# Patient Record
Sex: Male | Born: 1960 | Race: White | Hispanic: No | Marital: Married | Smoking: Never smoker
Health system: Southern US, Community
[De-identification: ages and names within clinical notes are randomized; demographics above are authoritative.]

## PROBLEM LIST (undated history)

## (undated) DIAGNOSIS — G479 Sleep disorder, unspecified: Secondary | ICD-10-CM

## (undated) DIAGNOSIS — I10 Essential (primary) hypertension: Secondary | ICD-10-CM

## (undated) DIAGNOSIS — T7840XA Allergy, unspecified, initial encounter: Secondary | ICD-10-CM

## (undated) DIAGNOSIS — K5792 Diverticulitis of intestine, part unspecified, without perforation or abscess without bleeding: Secondary | ICD-10-CM

## (undated) DIAGNOSIS — E785 Hyperlipidemia, unspecified: Secondary | ICD-10-CM

## (undated) DIAGNOSIS — J45909 Unspecified asthma, uncomplicated: Secondary | ICD-10-CM

## (undated) DIAGNOSIS — G4730 Sleep apnea, unspecified: Secondary | ICD-10-CM

## (undated) DIAGNOSIS — K219 Gastro-esophageal reflux disease without esophagitis: Secondary | ICD-10-CM

## (undated) HISTORY — DX: Allergy, unspecified, initial encounter: T78.40XA

## (undated) HISTORY — DX: Unspecified asthma, uncomplicated: J45.909

## (undated) HISTORY — PX: NO PAST SURGERIES: SHX2092

## (undated) HISTORY — DX: Diverticulitis of intestine, part unspecified, without perforation or abscess without bleeding: K57.92

## (undated) HISTORY — PX: WISDOM TOOTH EXTRACTION: SHX21

## (undated) HISTORY — DX: Sleep disorder, unspecified: G47.9

## (undated) HISTORY — DX: Gastro-esophageal reflux disease without esophagitis: K21.9

## (undated) HISTORY — DX: Hyperlipidemia, unspecified: E78.5

## (undated) HISTORY — DX: Essential (primary) hypertension: I10

## (undated) HISTORY — DX: Sleep apnea, unspecified: G47.30

---

## 2005-06-02 ENCOUNTER — Ambulatory Visit: Payer: Self-pay | Admitting: Internal Medicine

## 2005-06-23 ENCOUNTER — Inpatient Hospital Stay: Payer: Self-pay

## 2005-06-25 ENCOUNTER — Encounter: Payer: Self-pay | Admitting: Internal Medicine

## 2005-06-30 ENCOUNTER — Encounter: Payer: Self-pay | Admitting: Internal Medicine

## 2005-07-31 ENCOUNTER — Encounter: Payer: Self-pay | Admitting: Internal Medicine

## 2005-07-31 ENCOUNTER — Encounter (INDEPENDENT_AMBULATORY_CARE_PROVIDER_SITE_OTHER): Payer: Self-pay | Admitting: *Deleted

## 2005-07-31 ENCOUNTER — Ambulatory Visit: Payer: Self-pay | Admitting: Internal Medicine

## 2005-07-31 LAB — HM COLONOSCOPY

## 2006-09-07 IMAGING — CT CT ABD-PELV W/ CM
1 of 2 series · 15 of 32 positions shown, 20 images · non-contrast
Comparison: none

REASON FOR EXAM: Abdominal pain with IV and PO contrast
COMMENTS:

[Series 2: appendicitis · axial · 0.93mm/px · z∈[-528,-82]mm · 15 of 165 slices shown, 20 images]
[im 8/165  soft-tissue]
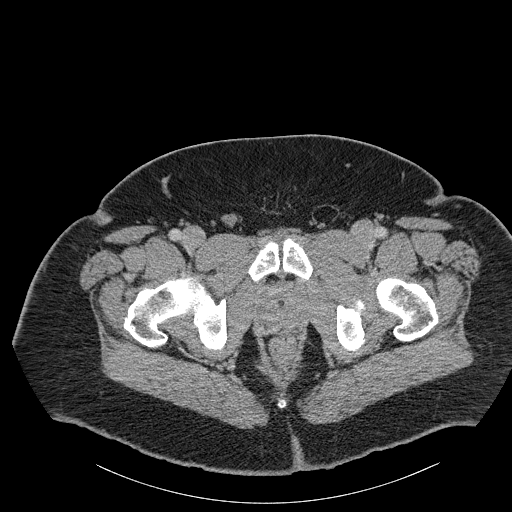
[im 8/165  bone]
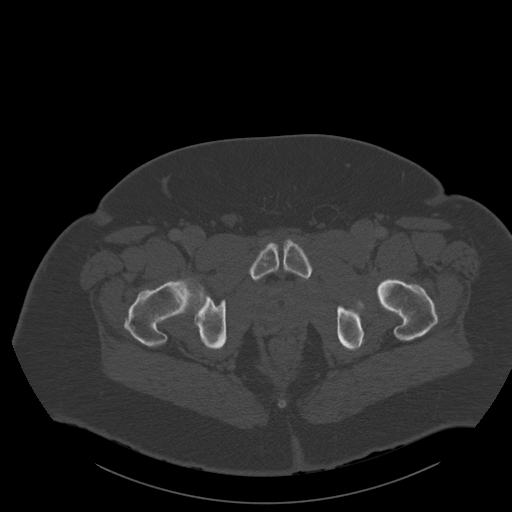
[im 22/165  soft-tissue]
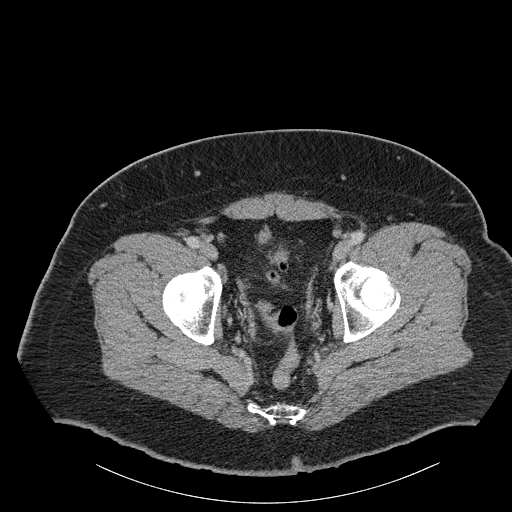
[im 29/165  soft-tissue]
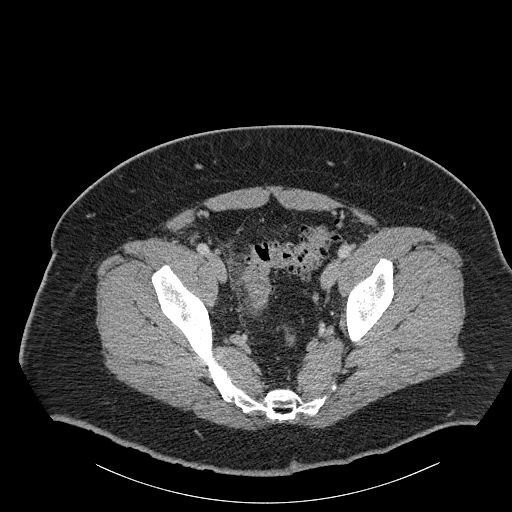
[im 43/165  soft-tissue]
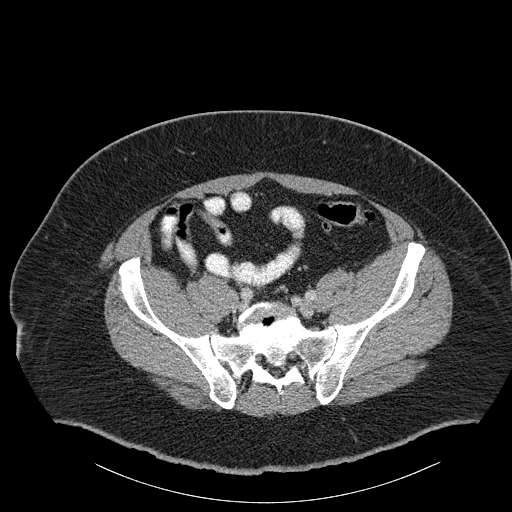
[im 58/165  soft-tissue]
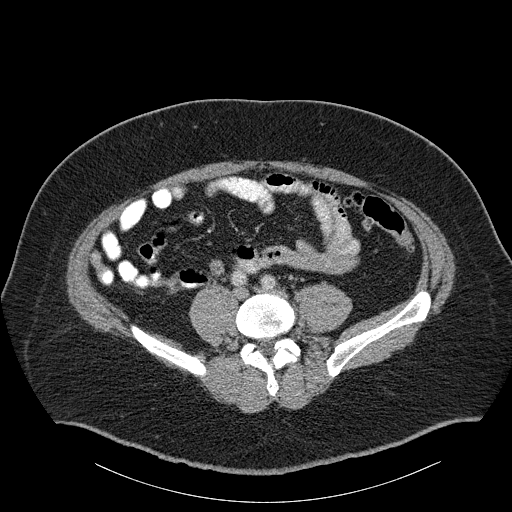
[im 65/165  soft-tissue]
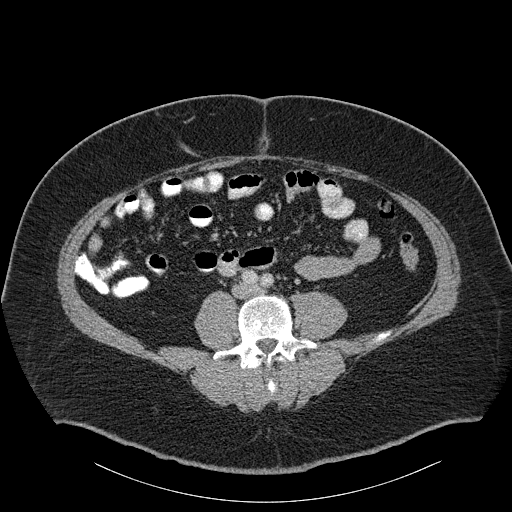
[im 79/165  soft-tissue]
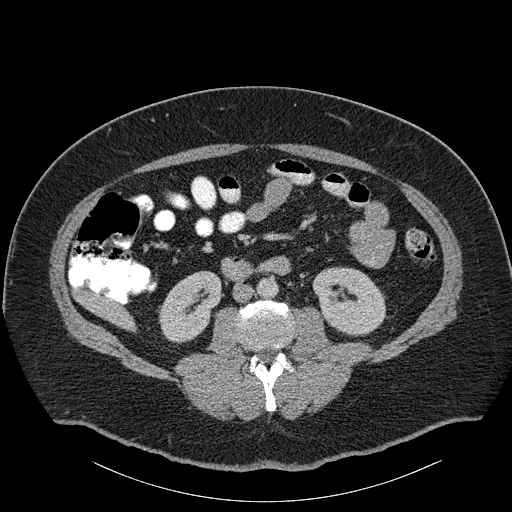
[im 86/165  soft-tissue]
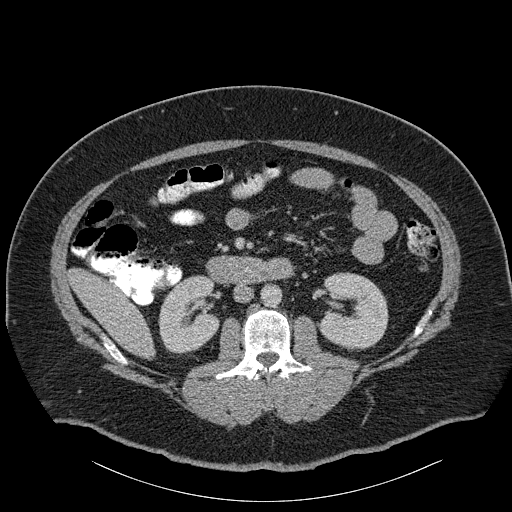
[im 100/165  soft-tissue]
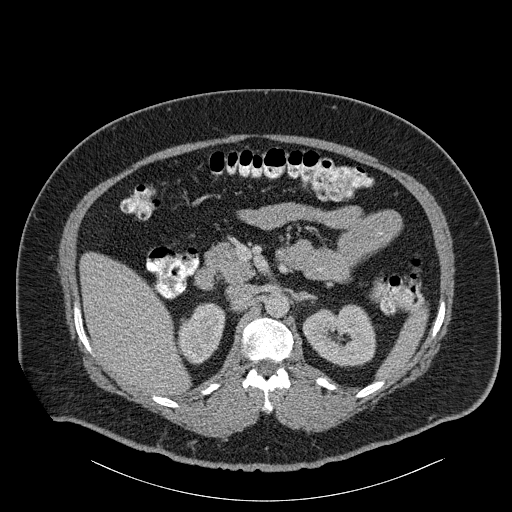
[im 100/165  bone]
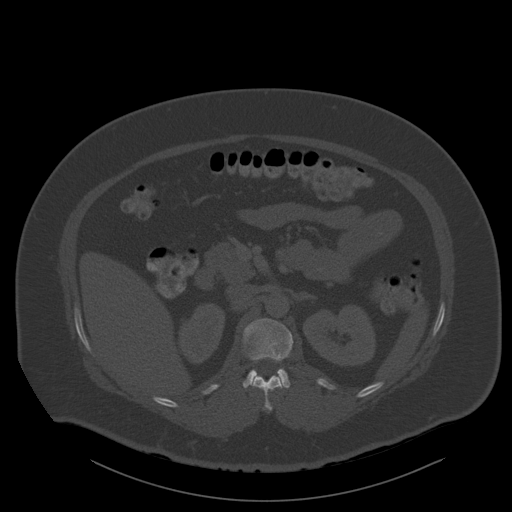
[im 107/165  soft-tissue]
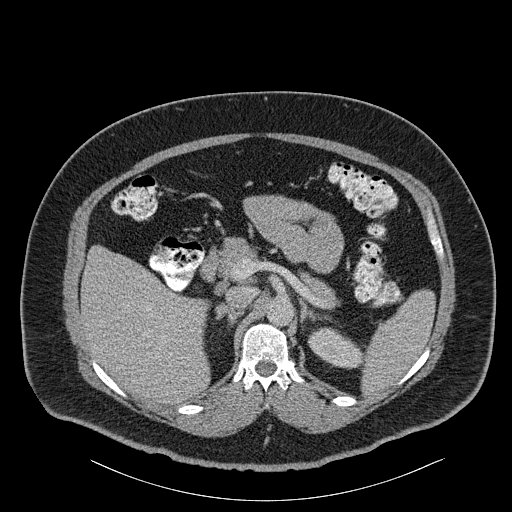
[im 122/165  soft-tissue]
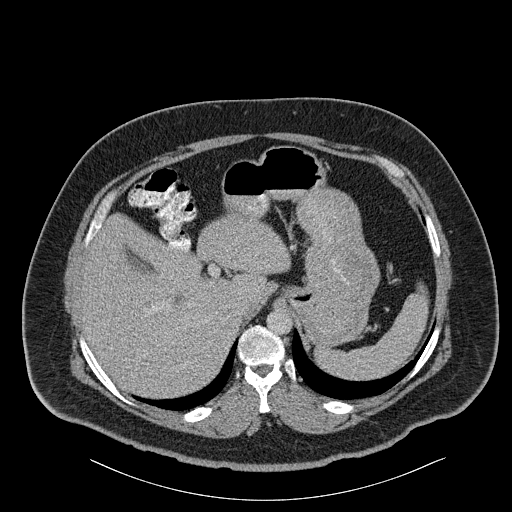
[im 136/165  soft-tissue]
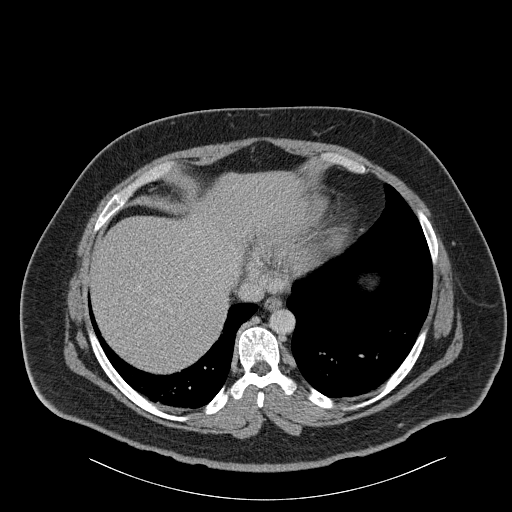
[im 136/165  lung]
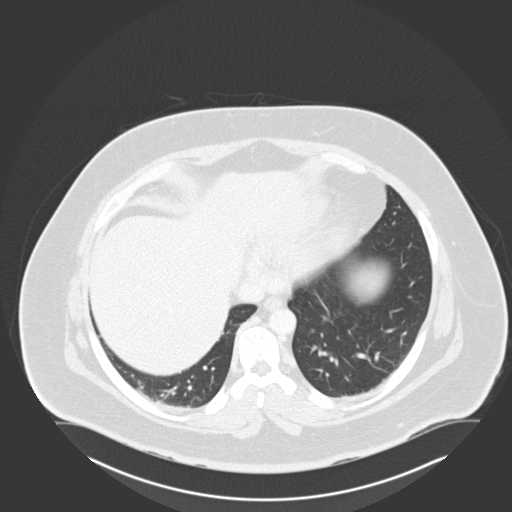
[im 143/165  soft-tissue]
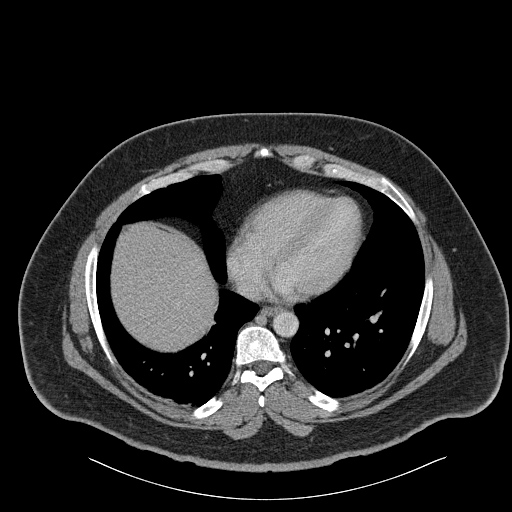
[im 143/165  lung]
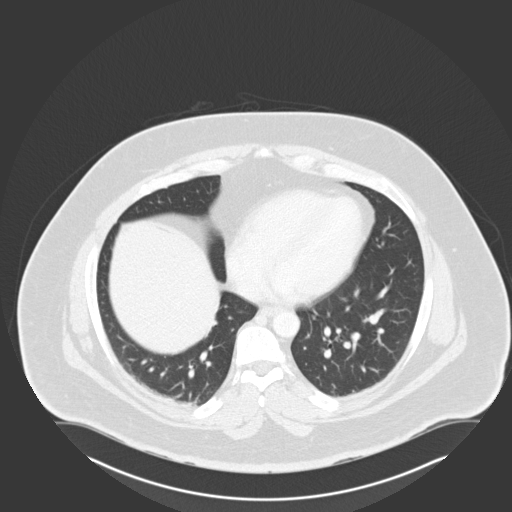
[im 150/165  lung]
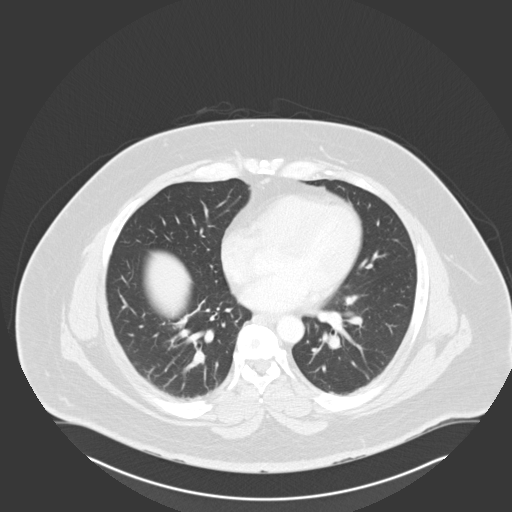
[im 157/165  soft-tissue]
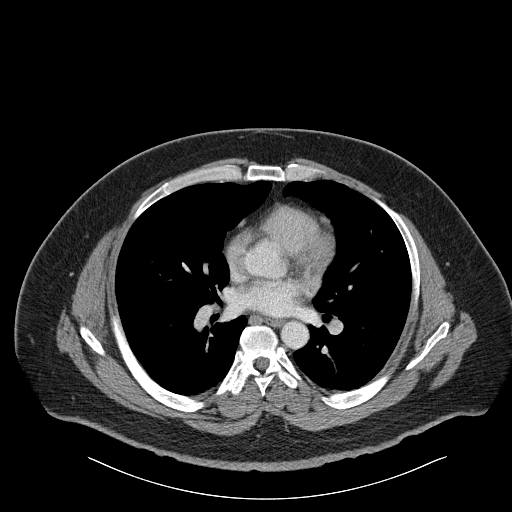
[im 157/165  lung]
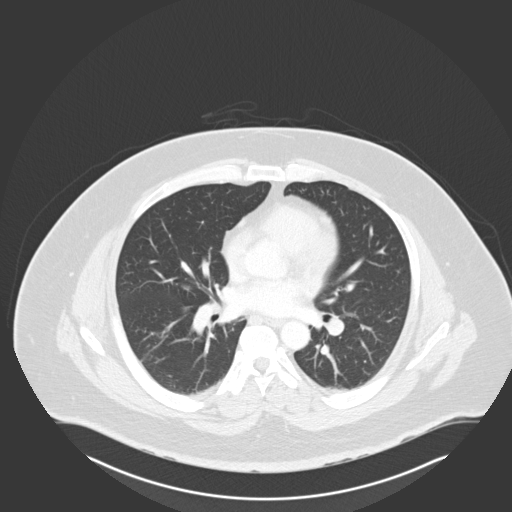

[15 of 32 positions shown; findings below may reference images not displayed]

PROCEDURE:     CT  - CT ABDOMEN / PELVIS  W  - June 23, 2005 [DATE]

RESULT:     IV and oral contrast-enhanced CT scan of the abdomen and pelvis
demonstrates a scattered diverticula in the colon especially in the sigmoid
region.  Foley balloon is present in the urinary bladder.  There is mild
inflammatory stranding to the RIGHT of midline adjacent to the sigmoid colon
best seen on the areas of image 137 and 138.  A definite abscess is not
identified.  No free air is seen.  No free fluid is evident.  The aorta is
normal in size.  The kidneys, liver, pancreas, gallbladder, spleen, and
adrenal glands appear to be unremarkable.
IMPRESSION: Findings consistent with sigmoid diverticulitis.  No focal abscess evident.

## 2007-03-15 ENCOUNTER — Ambulatory Visit: Payer: Self-pay | Admitting: Internal Medicine

## 2007-03-15 DIAGNOSIS — I10 Essential (primary) hypertension: Secondary | ICD-10-CM | POA: Insufficient documentation

## 2007-03-15 DIAGNOSIS — K219 Gastro-esophageal reflux disease without esophagitis: Secondary | ICD-10-CM | POA: Insufficient documentation

## 2007-03-18 LAB — CONVERTED CEMR LAB
Albumin: 3.7 g/dL (ref 3.5–5.2)
Basophils Absolute: 0 10*3/uL (ref 0.0–0.1)
Calcium: 8.7 mg/dL (ref 8.4–10.5)
Chloride: 105 meq/L (ref 96–112)
Eosinophils Absolute: 0.2 10*3/uL (ref 0.0–0.6)
GFR calc Af Amer: 116 mL/min
GFR calc non Af Amer: 96 mL/min
MCHC: 33.3 g/dL (ref 30.0–36.0)
MCV: 92.5 fL (ref 78.0–100.0)
Monocytes Relative: 12.7 % — ABNORMAL HIGH (ref 3.0–11.0)
Potassium: 3.4 meq/L — ABNORMAL LOW (ref 3.5–5.1)
RBC: 4.91 M/uL (ref 4.22–5.81)
RDW: 13 % (ref 11.5–14.6)
Sodium: 142 meq/L (ref 135–145)

## 2007-03-21 ENCOUNTER — Encounter: Payer: Self-pay | Admitting: Internal Medicine

## 2007-03-29 ENCOUNTER — Ambulatory Visit: Payer: Self-pay | Admitting: Internal Medicine

## 2007-03-29 LAB — CONVERTED CEMR LAB
Cholesterol: 165 mg/dL (ref 0–200)
HDL: 29.6 mg/dL — ABNORMAL LOW (ref >39.0)
LDL Cholesterol: 116 mg/dL — ABNORMAL HIGH (ref 0–99)
VLDL: 19 mg/dL (ref 0–40)

## 2007-04-10 ENCOUNTER — Encounter: Payer: Self-pay | Admitting: Internal Medicine

## 2007-11-15 ENCOUNTER — Ambulatory Visit: Payer: Self-pay | Admitting: Internal Medicine

## 2007-11-18 LAB — CONVERTED CEMR LAB
BUN: 13 mg/dL (ref 6–23)
Calcium: 8.9 mg/dL (ref 8.4–10.5)
Chloride: 104 meq/L (ref 96–112)
GFR calc Af Amer: 116 mL/min
GFR calc non Af Amer: 96 mL/min
Phosphorus: 3.1 mg/dL (ref 2.3–4.6)
Potassium: 3.9 meq/L (ref 3.5–5.1)
Sodium: 142 meq/L (ref 135–145)

## 2008-05-22 ENCOUNTER — Ambulatory Visit: Payer: Self-pay | Admitting: Internal Medicine

## 2008-05-22 DIAGNOSIS — G479 Sleep disorder, unspecified: Secondary | ICD-10-CM | POA: Insufficient documentation

## 2008-05-22 DIAGNOSIS — L219 Seborrheic dermatitis, unspecified: Secondary | ICD-10-CM | POA: Insufficient documentation

## 2008-06-05 ENCOUNTER — Ambulatory Visit: Payer: Self-pay | Admitting: Pulmonary Disease

## 2008-06-05 DIAGNOSIS — G4733 Obstructive sleep apnea (adult) (pediatric): Secondary | ICD-10-CM

## 2008-06-09 ENCOUNTER — Ambulatory Visit: Payer: Self-pay | Admitting: Family Medicine

## 2008-06-15 ENCOUNTER — Ambulatory Visit: Payer: Self-pay | Admitting: Family Medicine

## 2008-07-17 ENCOUNTER — Encounter: Payer: Self-pay | Admitting: Pulmonary Disease

## 2008-07-17 ENCOUNTER — Ambulatory Visit (HOSPITAL_BASED_OUTPATIENT_CLINIC_OR_DEPARTMENT_OTHER): Admission: RE | Admit: 2008-07-17 | Discharge: 2008-07-17 | Payer: Self-pay | Admitting: Pulmonary Disease

## 2008-07-21 ENCOUNTER — Ambulatory Visit: Payer: Self-pay | Admitting: Pulmonary Disease

## 2008-07-22 ENCOUNTER — Telehealth (INDEPENDENT_AMBULATORY_CARE_PROVIDER_SITE_OTHER): Payer: Self-pay | Admitting: *Deleted

## 2008-07-29 ENCOUNTER — Ambulatory Visit: Payer: Self-pay | Admitting: Pulmonary Disease

## 2008-08-13 ENCOUNTER — Encounter (INDEPENDENT_AMBULATORY_CARE_PROVIDER_SITE_OTHER): Payer: Self-pay | Admitting: *Deleted

## 2008-08-19 ENCOUNTER — Encounter: Payer: Self-pay | Admitting: Pulmonary Disease

## 2008-08-31 ENCOUNTER — Ambulatory Visit: Payer: Self-pay | Admitting: Pulmonary Disease

## 2008-09-24 ENCOUNTER — Encounter: Payer: Self-pay | Admitting: Pulmonary Disease

## 2008-09-24 ENCOUNTER — Ambulatory Visit (HOSPITAL_BASED_OUTPATIENT_CLINIC_OR_DEPARTMENT_OTHER): Admission: RE | Admit: 2008-09-24 | Discharge: 2008-09-24 | Payer: Self-pay | Admitting: Pulmonary Disease

## 2008-10-13 ENCOUNTER — Ambulatory Visit: Payer: Self-pay | Admitting: Pulmonary Disease

## 2008-10-19 ENCOUNTER — Telehealth: Payer: Self-pay | Admitting: Pulmonary Disease

## 2008-10-20 ENCOUNTER — Encounter: Payer: Self-pay | Admitting: Pulmonary Disease

## 2008-11-23 ENCOUNTER — Ambulatory Visit: Payer: Self-pay | Admitting: Internal Medicine

## 2008-11-23 DIAGNOSIS — J309 Allergic rhinitis, unspecified: Secondary | ICD-10-CM | POA: Insufficient documentation

## 2008-11-25 LAB — CONVERTED CEMR LAB
ALT: 26 units/L (ref 0–53)
AST: 25 units/L (ref 0–37)
Albumin: 3.8 g/dL (ref 3.5–5.2)
Alkaline Phosphatase: 67 units/L (ref 39–117)
BUN: 15 mg/dL (ref 6–23)
Basophils Relative: 2.2 % (ref 0.0–3.0)
Bilirubin, Direct: 0.2 mg/dL (ref 0.0–0.3)
Chloride: 107 meq/L (ref 96–112)
Cholesterol: 162 mg/dL (ref 0–200)
Creatinine, Ser: 0.8 mg/dL (ref 0.4–1.5)
Eosinophils Relative: 2.2 % (ref 0.0–5.0)
Glucose, Bld: 81 mg/dL (ref 70–99)
LDL Cholesterol: 107 mg/dL — ABNORMAL HIGH (ref 0–99)
Lymphocytes Relative: 26.1 % (ref 12.0–46.0)
MCV: 91.8 fL (ref 78.0–100.0)
Monocytes Absolute: 0.8 10*3/uL (ref 0.1–1.0)
Neutrophils Relative %: 61 % (ref 43.0–77.0)
Phosphorus: 4.3 mg/dL (ref 2.3–4.6)
Platelets: 230 10*3/uL (ref 150.0–400.0)
RBC: 4.53 M/uL (ref 4.22–5.81)
TSH: 1.55 microintl units/mL (ref 0.35–5.50)
Total Protein: 7 g/dL (ref 6.0–8.3)
Triglycerides: 111 mg/dL (ref 0.0–149.0)
WBC: 8.9 10*3/uL (ref 4.5–10.5)

## 2009-09-17 ENCOUNTER — Ambulatory Visit: Payer: Self-pay | Admitting: Internal Medicine

## 2010-05-03 ENCOUNTER — Ambulatory Visit: Payer: Self-pay | Admitting: Internal Medicine

## 2010-05-04 LAB — CONVERTED CEMR LAB
ALT: 33 units/L (ref 0–53)
AST: 24 units/L (ref 0–37)
BUN: 18 mg/dL (ref 6–23)
Basophils Absolute: 0 10*3/uL (ref 0.0–0.1)
Bilirubin, Direct: 0.1 mg/dL (ref 0.0–0.3)
Calcium: 8.8 mg/dL (ref 8.4–10.5)
Cholesterol: 174 mg/dL (ref 0–200)
Eosinophils Absolute: 0.2 10*3/uL (ref 0.0–0.7)
GFR calc non Af Amer: 105.41 mL/min (ref >60)
Glucose, Bld: 99 mg/dL (ref 70–99)
HDL: 34.2 mg/dL — ABNORMAL LOW (ref >39.00)
Lymphocytes Relative: 31.3 % (ref 12.0–46.0)
MCHC: 34.2 g/dL (ref 30.0–36.0)
Neutrophils Relative %: 54.2 % (ref 43.0–77.0)
PSA: 0.37 ng/mL (ref 0.10–4.00)
Phosphorus: 2.8 mg/dL (ref 2.3–4.6)
RDW: 14.2 % (ref 11.5–14.6)
Sodium: 141 meq/L (ref 135–145)
TSH: 1.64 microintl units/mL (ref 0.35–5.50)
Total Bilirubin: 1 mg/dL (ref 0.3–1.2)
Total Protein: 6.6 g/dL (ref 6.0–8.3)

## 2010-07-01 ENCOUNTER — Encounter: Payer: Self-pay | Admitting: Internal Medicine

## 2010-08-02 NOTE — Assessment & Plan Note (Signed)
Summary: 5 M F/U DLO   Vital Signs:  Patient profile:   50 year old male Weight:      363 pounds BMI:     53.03 Temp:     97.9 degrees F oral Pulse rate:   70 / minute Pulse rhythm:   regular BP sitting:   120 / 86  (left arm) Cuff size:   large  Vitals Entered By: Mervin Hack CMA Duncan Dull) (September 17, 2009 10:27 AM) CC: 6 month follow-up   History of Present Illness: doing fairly well  no new concerns except he has new place on his left cheek---irritated by razor no bleeding No rapid changes no pain  No chest pain  No SOB no edema  No stomach problems Bowels are regular  sleeps better on the CPAP  Allergies: No Known Drug Allergies  Past History:  Past medical, surgical, family and social histories (including risk factors) reviewed for relevance to current acute and chronic problems.  Past Medical History: Reviewed history from 11/23/2008 and no changes required. Dyslipidemia--HDL low Familial hematuria Seborrheic dermatitis Sleep disorder---obstructive sleep apnea Hyperlipidemia Hypertension GERD Allergic rhinitis  Past Surgical History: Reviewed history from 03/15/2007 and no changes required. 12/06 Diverticulitis--no surgery  Family History: Reviewed history from 11/23/2008 and no changes required. Dad died @61  COPD Mom with low iron, colon polyps 1 brother and 2 sisters---good health 1 sister had gastric bypass--no other obesity in family CAD, HTN in Dad No DM Cancer on Mom's side--lung cancer, mesothelioma, liver cancer Mat great aunt with colon cancer No prostate cancer  Social History: Reviewed history from 05/22/2008 and no changes required. 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   Neck:  supple, no masses, no thyromegaly, no carotid bruits, and no cervical lymphadenopathy.   Lungs:  normal respiratory effort and normal breath sounds.   Heart:  normal rate, regular rhythm, no murmur, and no gallop.   Abdomen:  soft and non-tender.   Extremities:  no edema Skin:   ~98mm seb keratosis on left cheek Psych:  normally interactive, good eye contact, not anxious appearing, and not depressed appearing.     Impression & Recommendations:  Problem # 1:  HYPERTENSION (ICD-401.9) Assessment Unchanged good control no changes needed  His updated medication list for this problem includes:    Hyzaar 100-25 Mg Tabs (Losartan potassium-hctz) .Marland Kitchen... Take 1 tablet by mouth once a day    Norvasc 5 Mg Tabs (Amlodipine besylate) .Marland Kitchen... Take 1 tablet by mouth once a day  BP today: 120/86 Prior BP: 126/80 (11/23/2008)  Labs Reviewed: K+: 3.8 (11/23/2008) Creat: : 0.8 (11/23/2008)   Chol: 162 (11/23/2008)   HDL: 32.90 (11/23/2008)   LDL: 107 (11/23/2008)   TG: 111.0 (11/23/2008)  Problem # 2:  OBSTRUCTIVE SLEEP APNEA (ICD-327.23) Assessment: Improved doing well with CPAP-18  Problem # 3:  GERD (ICD-530.81) Assessment: Unchanged fine with Rx  His updated medication list for this problem includes:    Prilosec Otc 20 Mg Tbec (Omeprazole magnesium) .Marland Kitchen... Take 1 tablet by mouth once a day  Problem # 4:  SEBORRHEIC KERATOSIS (ICD-702.19) Assessment: New reassured that lesion on cheek  is not worrisome  Complete Medication List: 1)  Hyzaar 100-25 Mg Tabs (Losartan potassium-hctz) .... Take 1 tablet by mouth once a day 2)  Norvasc 5 Mg Tabs (Amlodipine besylate) .... Take 1 tablet by mouth once a day 3)  Multivitamins Tabs (Multiple vitamin) .... Take 1 tablet by mouth once a day 4)  Prilosec Otc 20 Mg Tbec (Omeprazole magnesium) .... Take 1 tablet by  mouth once a day 5)  Fish Oil 1000 Mg Caps (Omega-3 fatty acids) .... Take 1 capsule by mouth two times a day 6)  Cetirizine Hcl 10 Mg Tabs (Cetirizine hcl) .... Take 1 by mouth once daily as needed  Patient Instructions: 1)  Please schedule a follow-up appointment in 6 months for physical  Current Allergies (reviewed today): No known allergies

## 2010-08-02 NOTE — Assessment & Plan Note (Signed)
Summary: cpx/dlo R/S FROM 03/18/10   Vital Signs:  Patient profile:   50 year old male Weight:      367 pounds Temp:     98.4 degrees F oral Pulse rate:   76 / minute Pulse rhythm:   regular BP sitting:   130 / 80  (left arm) Cuff size:   large  Vitals Entered By: Mervin Hack CMA Duncan Dull) (May 03, 2010 8:04 AM) CC: adult physical   History of Present Illness: DOing okay in general  Does have feeling of "film" over left eye gets "goo" in his eye. Notes in Am and never goes completely away Does notice reduced vision in that eye may be related to CPAP mask  Still travels a lot for work [employer]ined [employer] but hasn't gone much due to his schedule Still looking for home in [place] to reduce his commuting  Allergies: No Known Drug Allergies  Past History:  Past medical, surgical, family and social histories (including risk factors) reviewed for relevance to current acute and chronic problems.  Past Medical History: Reviewed history from 11/23/2008 and no changes required. Dyslipidemia--HDL low Familial hematuria Seborrheic dermatitis Sleep disorder---obstructive sleep apnea Hyperlipidemia Hypertension GERD Allergic rhinitis  Past Surgical History: Reviewed history from 03/15/2007 and no changes required. 12/06 Diverticulitis--no surgery  Family History: Reviewed history from 11/23/2008 and no changes required. Dad died @61  COPD Mom with low iron, colon polyps 1 brother and 2 sisters---good health 1 sister had gastric bypass--no other obesity in family CAD, HTN in Dad No DM Cancer on Mom's side--lung cancer, mesothelioma, liver cancer Mat great aunt with colon cancer No prostate cancer  Social History: Reviewed history from 05/22/2008 and no changes required. 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 Eyes:  Complains of eye irritation; denies vision loss-1 eye; left eye vision has changed. ENT:  Denies decreased hearing and ringing in ears; teeth fine--regular with dentist. CV:  Complains of shortness of breath with exertion; denies chest pain or discomfort, difficulty breathing at night, difficulty breathing while lying down, fainting, lightheadness, and palpitations; stable DOE. Resp:  Denies cough and shortness of breath. GI:  Complains of indigestion; denies abdominal pain, bloody stools, change in bowel habits, dark tarry stools, nausea, and vomiting; if heartburn acts up he uses the prilosec. GU:  Denies erectile dysfunction, urinary frequency, and urinary hesitancy. MS:  Complains of joint pain; denies joint swelling; Right CMC pain chronically---ibuprofen helps. Derm:  Complains of lesion(s); denies rash; raised area on stomach. Neuro:  Denies headaches, numbness, tingling, and weakness. Psych:  Denies anxiety and depression. Heme:  Denies abnormal bruising and enlarge lymph nodes. Allergy:  Complains of seasonal allergies and sneezing; cetirizine helps.  Physical Exam  General:  alert and normal appearance.   Eyes:  pupils equal, pupils round, pupils reactive to light, and no optic disk abnormalities.   Ears:  R ear normal and L ear normal.   Mouth:  no erythema, no exudates, and no lesions.   Neck:  supple and no masses.   Lungs:  normal respiratory effort, no intercostal retractions, no accessory muscle use, and normal breath sounds.   Heart:  normal rate, regular rhythm, no murmur, and no gallop.   Abdomen:  soft, non-tender, and no masses.  Rectal:  no hemorrhoids and no masses.   Prostate:  no gland enlargement and no nodules.   Msk:  no joint tenderness and no joint swelling.   Pulses:  1+ in feet Extremities:  no edema Neurologic:  alert & oriented X3, strength normal  in all extremities, and gait normal.   Skin:  no rashes and no suspicious lesions.   Multiple benign nevi seb keratosis on abd Axillary Nodes:  No palpable lymphadenopathy Psych:  normally interactive, good eye contact, not anxious appearing, and not depressed appearing.     Impression & Recommendations:  Problem # 1:  PREVENTIVE HEALTH CARE (ICD-V70.0) Assessment Comment Only  healthy but sig obesity discussed fitness will check PSA after discussion colon due 2012  Orders: TLB-PSA (Prostate Specific Antigen) (84153-PSA)  Problem # 2:  HYPERTENSION (ICD-401.9) Assessment: Unchanged  good control no changes needed  His updated medication list for this problem includes:    Hyzaar 100-25 Mg Tabs (Losartan potassium-hctz) .Marland Kitchen... Take 1 tablet by mouth once a day    Norvasc 5 Mg Tabs (Amlodipine besylate) .Marland Kitchen... Take 1 tablet by mouth once a day  BP today: 130/80 Prior BP: 120/86 (09/17/2009)  Labs Reviewed: K+: 3.8 (11/23/2008) Creat: : 0.8 (11/23/2008)   Chol: 162 (11/23/2008)   HDL: 32.90 (11/23/2008)   LDL: 107 (11/23/2008)   TG: 111.0 (11/23/2008)  Orders: TLB-Renal Function Panel (80069-RENAL) TLB-CBC Platelet - w/Differential (85025-CBCD) TLB-TSH (Thyroid Stimulating Hormone) (84443-TSH) Venipuncture ([address])  Problem # 3:  OBSTRUCTIVE SLEEP APNEA (ICD-327.23) Assessment: Improved doing well with CPAP  Problem # 4:  GERD (ICD-530.81) Assessment: Unchanged only needs meds as needed   His updated medication list for this problem includes:    Prilosec Otc 20 Mg Tbec (Omeprazole magnesium) .Marland Kitchen... Take 1 tablet by mouth once a day as needed for acid problems  Complete Medication List: 1)  Hyzaar 100-25 Mg Tabs (Losartan potassium-hctz) .... Take 1 tablet by mouth once a day 2)  Norvasc 5 Mg Tabs (Amlodipine besylate) .... Take 1 tablet by mouth once a day 3)  Multivitamins Tabs (Multiple vitamin) .... Take 1 tablet by mouth once a day 4)  Prilosec Otc 20 Mg Tbec  (Omeprazole magnesium) .... Take 1 tablet by mouth once a day as needed for acid problems 5)  Fish Oil 1000 Mg Caps (Omega-3 fatty acids) .... Take 1 capsule by mouth two times a day 6)  Cetirizine Hcl 10 Mg Tabs (Cetirizine hcl) .... Take 1 by mouth once daily as needed 7)  Cpap 18   Other Orders: Flu Vaccine 75yrs + (60454) Admin 1st Vaccine (09811) TLB-Lipid Panel (80061-LIPID) TLB-Hepatic/Liver Function Pnl (80076-HEPATIC)  Patient Instructions: 1)  Please schedule a follow-up appointment in 6 months .  Prescriptions: NORVASC 5 MG  TABS (AMLODIPINE BESYLATE) Take 1 tablet by mouth once a day  #30 x 12   Entered by:   Mervin Hack CMA (AAMA)   Authorized by:   Cindee Salt MD   Signed by:   Mervin Hack CMA (AAMA) on 05/03/2010   Method used:   Electronically to        [facility] [phone]* (retail)       [address] [place], [place]  [address]       Ph: [phone] or [phone]       Fax: [phone]   RxID:   [id number] HYZAAR 100-25 MG  TABS (LOSARTAN POTASSIUM-HCTZ)  Take 1 tablet by mouth once a day  #30 x 12   Entered by:   Mervin Hack CMA (AAMA)   Authorized by:   Cindee Salt MD   Signed by:   Mervin Hack CMA (AAMA) on 05/03/2010   Method used:   Electronically to        [facility] [phone]* (retail)       [address] [place], [place]  [address]       Ph: [phone] or [phone]       Fax: [phone]   RxID:   [id number]    Orders Added: 1)  Flu Vaccine 53yrs + [32202] 2)  Admin 1st Vaccine [90471] 3)  Est. Patient 40-64 years [99396] 4)  TLB-PSA (Prostate Specific Antigen) [84153-PSA] 5)  TLB-Lipid Panel [80061-LIPID] 6)  TLB-Hepatic/Liver Function Pnl [80076-HEPATIC] 7)  TLB-Renal Function Panel [80069-RENAL] 8)  TLB-CBC Platelet - w/Differential [85025-CBCD] 9)  TLB-TSH (Thyroid Stimulating Hormone) [84443-TSH] 10)  Venipuncture [54270]   Immunizations  Administered:  Influenza Vaccine # 1:    Vaccine Type: Fluvax 3+    Site: left deltoid    Mfr: GlaxoSmithKline    Dose: 0.5 ml    Route: IM    Given by: Mervin Hack CMA (AAMA)    Exp. Date: 12/31/2010    Lot #: [id number]    VIS given: 01/25/10 version given May 03, 2010.  Flu Vaccine Consent Questions:    Do you have a history of severe allergic reactions to this vaccine? no    Any prior history of allergic reactions to egg and/or gelatin? no    Do you have a sensitivity to the preservative Thimersol? no    Do you have a past history of Guillan-Barre Syndrome? no    Do you currently have an acute febrile illness? no    Have you ever had a severe reaction to latex? no    Vaccine information given and explained to patient? yes   Immunizations Administered:  Influenza Vaccine # 1:    Vaccine Type: Fluvax 3+    Site: left deltoid    Mfr: GlaxoSmithKline    Dose: 0.5 ml    Route: IM    Given by: Mervin Hack CMA (AAMA)    Exp. Date: 12/31/2010    Lot #: [id number]    VIS given: 01/25/10 version given May 03, 2010.  Current Allergies (reviewed today): No known allergies

## 2010-08-04 NOTE — Letter (Signed)
Summary: Colonoscopy Letter  [facility]  [address] [place], [place] [address]   Phone: [phone]  Fax: [phone]      July 01, 2010 MRN: [id number]   Jason Willis [address] [place], [place]  [address]   Dear Mr. UMHOLTZ,   According to your medical record, it is time for you to schedule a Colonoscopy. The American Cancer Society recommends this procedure as a method to detect early colon cancer. Patients with a family history of colon cancer, or a personal history of colon polyps or inflammatory bowel disease are at increased risk.  This letter has been generated based on the recommendations made at the time of your procedure. If you feel that in your particular situation this may no longer apply, please contact our office.  Please call our office at [phone] to schedule this appointment or to update your records at your earliest convenience.  Thank you for cooperating with [place] to provide you with the very best care possible.   Sincerely,  Wilhemina Bonito. Marina Goodell, M.D.  [facility] [phone]

## 2010-09-17 ENCOUNTER — Encounter: Payer: Self-pay | Admitting: Internal Medicine

## 2010-11-04 ENCOUNTER — Ambulatory Visit: Payer: Self-pay | Admitting: Internal Medicine

## 2010-11-15 NOTE — Procedures (Signed)
NAME:  Jason Willis, Jason Willis NO.:  [id number]   MEDICAL RECORD NO.:  [id number]          PATIENT TYPE:  OUT   LOCATION:  SLEEP CENTER                 FACILITY:  [facility]   PHYSICIAN:  Barbaraann Share, MD,FCCPDATE OF BIRTH:  1961/06/20   DATE OF STUDY:  07/17/2008                            NOCTURNAL POLYSOMNOGRAM   REFERRING PHYSICIAN:  Barbaraann Share, MD,FCCP   INDICATION FOR THE STUDY:  Hypersomnia with sleep apnea.   EPWORTH SLEEPINESS SCORE:  9.   SLEEP ARCHITECTURE:  The patient had a total sleep time of 401 minutes  with no slow wave sleep and significantly decreased REM.  Sleep onset  latency was rapid at 3.5 minutes and REM onset was prolonged at 209  minutes.  Sleep efficiency was fairly good at 91%.   RESPIRATORY DATA:  The patient was found to have 604 apneas and 206  hypopneas for an apnea-hypopnea index of 121 events per hour.  The  events occurred in all body positions and there was loud snoring noted  throughout.   OXYGEN DATA:  There was O2 desaturation as low as 66% with the patient's  obstructive events.   CARDIAC DATA:  No clinically significant arrhythmias were noted.   MOVEMENT-PARASOMNIA:  The patient had no significant leg jerks or  behavioral abnormalities.   IMPRESSIONS-RECOMMENDATIONS:  Extremely severe obstructive sleep apnea  with an apnea-hypopnea index of 121 events per hour and oxygen  desaturation as low as 66%.  Treatment for this degree of sleep apnea  should focus primarily on weight loss as well as CPAP.  Given the  severity of the patient's sleep apnea, he will require a formal CPAP  titration study for pressure optimization.      Barbaraann Share, MD,FCCP  Diplomate, American Board of Sleep  Medicine  Electronically Signed     [facility]/MEDQ  D:  07/21/2008 14:50:08  T:  07/22/2008 03:17:54  Job:  [id number]

## 2010-11-15 NOTE — Procedures (Signed)
NAME:  Jason Willis, Jason Willis NO.:  [id number]   MEDICAL RECORD NO.:  [id number]          PATIENT TYPE:  OUT   LOCATION:  SLEEP CENTER                 FACILITY:  [facility]   PHYSICIAN:  Barbaraann Share, MD,FCCPDATE OF BIRTH:  [birth year]   DATE OF STUDY:  09/24/2008                            NOCTURNAL POLYSOMNOGRAM   REFERRING PHYSICIAN:  Barbaraann Share, MD,FCCP   INDICATION FOR STUDY:  Hypersomnia with sleep apnea.  The patient  returns for pressure optimization after being diagnosed with severe  obstructive sleep apnea.   EPWORTH SLEEPINESS SCORE:  1.   MEDICATIONS:   SLEEP ARCHITECTURE:  The patient was found to have a total sleep time of  286 minutes with no slow wave sleep and decreased quantity of REM sleep  with onset latency was fairly rapid at 5 minutes, and REM onset was  prolonged at 295 minutes.  Sleep efficiency was moderately decreased at  70%.   RESPIRATORY DATA:  The patient underwent CPAP titration initially with a  medium ResMed full face mask from home.  During the study, however, this  mask began to have significant leaks, and therefore he was changed over  to a medium Quattro full-face mask.  CPAP titration was initiated at 6  cm and gradually increased in order to treat both obstructive events and  snoring.  At a final pressure of 22 cm of water, the patient seemed to  have good control of both, and reasonable tolerance.   OXYGEN DATA:  There was O2 desaturation as low as 86% prior to optimal  CPAP being reached.   CARDIAC DATA:  No clinically significant arrhythmias were seen.   MOVEMENT-PARASOMNIA:  The patient had no significant leg jerks or  abnormal behavior seen.   IMPRESSIONS-RECOMMENDATIONS:  Good control of previously documented  severe obstructive sleep apnea with a CPAP pressure of 22 cm, and  delivered by a medium ResMed Quattro full-face mask.  The patient should  also be encouraged to work aggressively on weight loss.      Barbaraann Share, MD,FCCP  Diplomate, American Board of Sleep  Medicine  Electronically Signed     [facility]/MEDQ  D:  10/13/2008 15:40:45  T:  10/14/2008 03:26:11  Job:  [id number]

## 2010-11-18 ENCOUNTER — Ambulatory Visit: Payer: Self-pay | Admitting: Internal Medicine

## 2011-02-17 ENCOUNTER — Ambulatory Visit: Payer: Self-pay | Admitting: Internal Medicine

## 2011-04-07 ENCOUNTER — Ambulatory Visit: Payer: Self-pay | Admitting: Internal Medicine

## 2011-05-05 ENCOUNTER — Ambulatory Visit: Payer: Self-pay | Admitting: Internal Medicine

## 2011-05-12 ENCOUNTER — Other Ambulatory Visit: Payer: Self-pay | Admitting: Internal Medicine

## 2011-05-17 ENCOUNTER — Other Ambulatory Visit: Payer: Self-pay | Admitting: Internal Medicine

## 2011-05-19 ENCOUNTER — Ambulatory Visit: Payer: Self-pay | Admitting: Internal Medicine

## 2011-07-14 ENCOUNTER — Encounter: Payer: Self-pay | Admitting: Internal Medicine

## 2011-07-14 ENCOUNTER — Ambulatory Visit (INDEPENDENT_AMBULATORY_CARE_PROVIDER_SITE_OTHER): Payer: 59 | Admitting: Internal Medicine

## 2011-07-14 ENCOUNTER — Other Ambulatory Visit: Payer: Self-pay | Admitting: *Deleted

## 2011-07-14 VITALS — BP 120/80 | HR 78 | Temp 97.7°F | Ht 69.0 in | Wt 370.0 lb

## 2011-07-14 DIAGNOSIS — I10 Essential (primary) hypertension: Secondary | ICD-10-CM

## 2011-07-14 DIAGNOSIS — Z23 Encounter for immunization: Secondary | ICD-10-CM

## 2011-07-14 DIAGNOSIS — Z125 Encounter for screening for malignant neoplasm of prostate: Secondary | ICD-10-CM

## 2011-07-14 DIAGNOSIS — M722 Plantar fascial fibromatosis: Secondary | ICD-10-CM

## 2011-07-14 DIAGNOSIS — E8881 Metabolic syndrome: Secondary | ICD-10-CM | POA: Insufficient documentation

## 2011-07-14 DIAGNOSIS — K219 Gastro-esophageal reflux disease without esophagitis: Secondary | ICD-10-CM

## 2011-07-14 DIAGNOSIS — E785 Hyperlipidemia, unspecified: Secondary | ICD-10-CM

## 2011-07-14 LAB — BASIC METABOLIC PANEL
Chloride: 103 mEq/L (ref 96–112)
Potassium: 3.8 mEq/L (ref 3.5–5.1)

## 2011-07-14 LAB — CBC WITH DIFFERENTIAL/PLATELET
Basophils Relative: 0.3 % (ref 0.0–3.0)
Eosinophils Relative: 2.8 % (ref 0.0–5.0)
HCT: 43.7 % (ref 39.0–52.0)
Lymphs Abs: 2.6 10*3/uL (ref 0.7–4.0)
MCV: 94.3 fl (ref 78.0–100.0)
Monocytes Absolute: 1 10*3/uL (ref 0.1–1.0)
Monocytes Relative: 11 % (ref 3.0–12.0)
Neutrophils Relative %: 57.4 % (ref 43.0–77.0)
RBC: 4.63 Mil/uL (ref 4.22–5.81)
WBC: 9.1 10*3/uL (ref 4.5–10.5)

## 2011-07-14 LAB — PSA: PSA: 0.37 ng/mL (ref 0.10–4.00)

## 2011-07-14 LAB — HEPATIC FUNCTION PANEL
ALT: 36 U/L (ref 0–53)
AST: 26 U/L (ref 0–37)
Alkaline Phosphatase: 66 U/L (ref 39–117)
Total Bilirubin: 0.6 mg/dL (ref 0.3–1.2)

## 2011-07-14 LAB — LIPID PANEL: Total CHOL/HDL Ratio: 5

## 2011-07-14 MED ORDER — MECLIZINE HCL 25 MG PO TABS: 25.0000 mg | ORAL_TABLET | Freq: Three times a day (TID) | ORAL | Status: DC | PRN

## 2011-07-14 NOTE — Assessment & Plan Note (Signed)
Discussed lifestyle measures 

## 2011-07-14 NOTE — Telephone Encounter (Signed)
Spoke with patient and advised results rx sent to pharmacy by e-script  

## 2011-07-14 NOTE — Assessment & Plan Note (Signed)
Controlled with the PPI He will try to skip doses

## 2011-07-14 NOTE — Assessment & Plan Note (Signed)
Discussed better exercise regimen and weight loss No meds for now

## 2011-07-14 NOTE — Assessment & Plan Note (Signed)
BP Readings from Last 3 Encounters:  07/14/11 120/80  05/03/10 130/80  09/17/09 120/86   Good control No changes needed Due for labs

## 2011-07-14 NOTE — Progress Notes (Signed)
Subjective:    Patient ID: Jason Willis, male    DOB: 1960-11-22, 51 y.o.   MRN: [id number]  HPI Doing okay  Has had some trouble with right heel Thinks he has plantar fasciitis Does sound like that Some improvement with inserts but feels he needs better ones  Stressful year Wife diagnosed with breast cancer---surgery and RT Still travels a lot  BP seems to be fine No chest pain No SOB No dizziness or syncope No palpitations  Had been walking until heel problems Had lost weight but it came back on since less activities  Stomach is quiet Sticks with omeprazole regularly  Current Outpatient Prescriptions on File Prior to Visit  Medication Sig Dispense Refill  . amLODipine (NORVASC) 5 MG tablet TAKE 1 TABLET BY MOUTH ONCE A DAY  30 tablet  11  . cetirizine (ZYRTEC) 10 MG chewable tablet Chew 10 mg by mouth. Take 1 by mouth once daily as needed       . losartan-hydrochlorothiazide (HYZAAR) 100-25 MG per tablet TAKE 1 TABLET BY MOUTH ONCE A DAY  30 tablet  11  . multivitamin (THERAGRAN) per tablet Take 1 tablet by mouth daily.        . Omega-3 Fatty Acids (FISH OIL) 1000 MG CAPS Take by mouth. Take 1 capsule by mouth two times a day       . omeprazole (PRILOSEC OTC) 20 MG tablet Take 20 mg by mouth. Take 1 tablet by mouth once a day as needed for acid problems         No Known Allergies  Past Medical History  Diagnosis Date  . Dyslipidemia     HDL low   . Familial benign hematuria   . Seborrheic dermatitis   . Sleep disorder     obstructive sleep apnea  . Hyperlipidemia   . Hypertension   . GERD (gastroesophageal reflux disease)   . Allergy   . Diverticulitis     No past surgical history on file.  Family History  Problem Relation Age of Onset  . Colon polyps Mother   . Anemia Mother   . COPD Father   . Hypertension Father   . Coronary artery disease Father   . Obesity Sister     gastric bypass   . Diabetes Neg Hx     History   Social History  .  Marital Status: Married    Spouse Name: N/A    Number of Children: 0  . Years of Education: N/A   Occupational History  . 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 Smoking status: Never Smoker   . Smokeless tobacco: Never Used  . Alcohol Use: Yes     occasionally  . Drug Use: Not on file  . Sexually Active: Not on file   Other Topics Concern  . Not on file   Social History Narrative   Widowed then remarried 6/05   Review of Systems Sleeps okay Uses CPAP --18 Appetite is okay     Objective:   Physical Exam  Constitutional: He appears well-developed and well-nourished. No distress.  Neck: Normal range of motion. Neck supple. No thyromegaly present.  Cardiovascular: Normal rate, regular rhythm and normal heart sounds.  Exam reveals no gallop.   No murmur heard. Pulmonary/Chest: Effort normal and breath sounds normal. No respiratory distress. He has no wheezes. He has no rales.  Musculoskeletal: He exhibits  no edema and no tenderness.       Thick calves without pitting  Lymphadenopathy:    He has no cervical adenopathy.  Psychiatric: He has a normal mood and affect. His behavior is normal. Judgment and thought content normal.          Assessment & Plan:

## 2011-07-14 NOTE — Patient Instructions (Signed)
Please set up appt soon with Dr Patsy Lager to evaluate plantar fasciits

## 2011-07-14 NOTE — Telephone Encounter (Signed)
I don't recommend the patches as side effects are much more common than with meclizine Okay to Rx meclizine 25mg  tid prn for motion sickness Okay to dispense enough for his trip--or he can get them OTC

## 2011-07-14 NOTE — Assessment & Plan Note (Signed)
Will set up eval with Dr Patsy Lager

## 2011-07-14 NOTE — Telephone Encounter (Signed)
Patient had OV today and forgot to ask you for motion sickness patches, he's going on a cruise and would like something for motion sickness. Please advise.

## 2011-07-27 ENCOUNTER — Encounter: Payer: Self-pay | Admitting: Family Medicine

## 2011-07-27 ENCOUNTER — Ambulatory Visit (INDEPENDENT_AMBULATORY_CARE_PROVIDER_SITE_OTHER): Payer: 59 | Admitting: Family Medicine

## 2011-07-27 VITALS — BP 120/74 | HR 82 | Temp 98.9°F | Ht 70.0 in | Wt 373.8 lb

## 2011-07-27 DIAGNOSIS — M722 Plantar fascial fibromatosis: Secondary | ICD-10-CM

## 2011-07-27 NOTE — Patient Instructions (Signed)

## 2011-07-27 NOTE — Progress Notes (Signed)
The patient presents with a 2-3 month long history of heel pain all on the Right. This is notable for worsening pain first thing in the morning when arising and standing after sitting.   Prior foot or ankle fractures: none Prior operations: none Orthotics or bracing: none Medications: occ otc tylenol or nsaids PT or home rehab: none Night splints: no Ice massage: yes Ball massage: no  Metatarsal pain: no  The PMH, PSH, Social History, Family History, Medications, and allergies have been reviewed in [facility], and have been updated if relevant.  REVIEW OF SYSTEMS  GEN: No fevers, chills. Nontoxic. Primarily MSK c/o today. MSK: Detailed in the HPI GI: tolerating PO intake without difficulty Neuro: No numbness, parasthesias, or tingling associated. Otherwise the pertinent positives of the ROS are noted above.   PHYSICAL EXAM  Blood pressure 120/74, pulse 82, temperature 98.9 F (37.2 C), temperature source Oral, height 5\' 10"  (1.778 m), weight 373 lb 12.8 oz (169.555 kg), SpO2 98.00%.  Body mass index is 53.63 kg/(m^2).  GEN: Well-developed,well-nourished,in no acute distress; alert,appropriate and cooperative throughout examination HEENT: Normocephalic and atraumatic without obvious abnormalities. Ears, externally no deformities PULM: Breathing comfortably in no respiratory distress EXT: No clubbing, cyanosis, or edema PSYCH: Normally interactive. Cooperative during the interview. Pleasant. Friendly and conversant. Not anxious or depressed appearing. Normal, full affect.  Echymosis: no Edema: no ROM: full LE B Gait: heel toe, pronation MT pain: no Lateral Mall: NT Medial Mall: NT Talus: NT Navicular: NT Calcaneous: NT Metatarsals: NT 5th MT: NT Phalanges: NT Achilles: NT Plantar Fascia: tender, medial along PF. Pain with forced dorsi Fat Pad: NT Peroneals: NT Post Tib: NT Great Toe: Nml motion Ant Drawer: neg Other foot breakdown: none Long arch: collapse Transverse  arch: significant toe splaying, forefoot collapse Hindfoot breakdown: none Sensation: intact  A/P: Plantar fascitis:  >25 minutes spent in face to face time with patient, >50% spent in counselling or coordination of care  We reviewed that stretching is critically important to the treatment of PF. Reviewed footwear. Rigid soles have been shown to help with PF.  Reviewed classic program from [facility] for PF Given Tuli's Heel cups  Reviewed anatomy Given also my rehab program in p/i  His OTC orthotics look good - I would keep just using them for now  F/u in 2 mo if not improving

## 2012-01-19 ENCOUNTER — Encounter: Payer: 59 | Admitting: Internal Medicine

## 2012-02-02 ENCOUNTER — Encounter: Payer: 59 | Admitting: Internal Medicine

## 2012-03-15 ENCOUNTER — Encounter: Payer: Self-pay | Admitting: Internal Medicine

## 2012-03-15 ENCOUNTER — Ambulatory Visit (INDEPENDENT_AMBULATORY_CARE_PROVIDER_SITE_OTHER): Payer: 59 | Admitting: Internal Medicine

## 2012-03-15 VITALS — BP 132/90 | HR 82 | Temp 98.1°F | Wt 370.5 lb

## 2012-03-15 DIAGNOSIS — K219 Gastro-esophageal reflux disease without esophagitis: Secondary | ICD-10-CM

## 2012-03-15 DIAGNOSIS — E785 Hyperlipidemia, unspecified: Secondary | ICD-10-CM | POA: Insufficient documentation

## 2012-03-15 DIAGNOSIS — Z0000 Encounter for general adult medical examination without abnormal findings: Secondary | ICD-10-CM

## 2012-03-15 DIAGNOSIS — I10 Essential (primary) hypertension: Secondary | ICD-10-CM

## 2012-03-15 NOTE — Assessment & Plan Note (Signed)
Generally healthy Needs to work more on fitness PSA recently done Had colonoscopy

## 2012-03-15 NOTE — Assessment & Plan Note (Signed)
Controlled with omeprazole intermittently

## 2012-03-15 NOTE — Assessment & Plan Note (Signed)
BP Readings from Last 3 Encounters:  03/15/12 132/90  07/27/11 120/74  07/14/11 120/80   Good control No changes

## 2012-03-15 NOTE — Assessment & Plan Note (Signed)
Low HDL Needs to work on lifestyle issues

## 2012-03-15 NOTE — Progress Notes (Signed)
Subjective:    Patient ID: Jason Willis, male    DOB: [birth year], 51 y.o.   MRN: [id number]  HPI Here for physical Heel is better Walking some---riding bike more  No new concerns Checks BP once a week or 2 125/80 or so  Wife is doing well Now 1 year post breast cancer Rx  Current Outpatient Prescriptions on File Prior to Visit  Medication Sig Dispense Refill  . amLODipine (NORVASC) 5 MG tablet TAKE 1 TABLET BY MOUTH ONCE A DAY  30 tablet  11  . aspirin 81 MG tablet Take 81 mg by mouth daily.      . cetirizine (ZYRTEC) 10 MG chewable tablet Chew 10 mg by mouth. Take 1 by mouth once daily as needed       . glucosamine-chondroitin 500-400 MG tablet Take 1 tablet by mouth daily.      Marland Kitchen losartan-hydrochlorothiazide (HYZAAR) 100-25 MG per tablet TAKE 1 TABLET BY MOUTH ONCE A DAY  30 tablet  11  . multivitamin (THERAGRAN) per tablet Take 1 tablet by mouth daily.        . Omega-3 Fatty Acids (FISH OIL) 1000 MG CAPS Take by mouth. Take 1 capsule by mouth two times a day       . omeprazole (PRILOSEC OTC) 20 MG tablet Take 20 mg by mouth. Take 1 tablet by mouth once a day as needed for acid problems       . meclizine (ANTIVERT) 25 MG tablet Take 1 tablet (25 mg total) by mouth 3 (three) times daily as needed.  60 tablet  0    No Known Allergies  Past Medical History  Diagnosis Date  . Dyslipidemia     HDL low   . Familial benign hematuria   . Seborrheic dermatitis   . Sleep disorder     obstructive sleep apnea  . Hyperlipidemia   . Hypertension   . GERD (gastroesophageal reflux disease)   . Allergy   . Diverticulitis     No past surgical history on file.  Family History  Problem Relation Age of Onset  . Colon polyps Mother   . Anemia Mother   . COPD Father   . Hypertension Father   . Coronary artery disease Father   . Obesity Sister     gastric bypass   . Diabetes Neg Hx     History   Social History  . Marital Status: Married    Spouse Name: N/A    Number of  Children: 0  . Years of Education: N/A   Occupational History  . 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 Smoking status: Never Smoker   . Smokeless tobacco: Never Used  . Alcohol Use: Yes     occasionally  . Drug Use: Not on file  . Sexually Active: Not on file   Other Topics Concern  . Not on file   Social History Narrative   Widowed then remarried 6/05   Review of Systems  Constitutional: Negative for fatigue and unexpected weight change.       Wears seat belt  HENT: Positive for congestion and rhinorrhea. Negative for hearing loss, dental problem and tinnitus.        Uses zyrtec prn Regular with dentist  Eyes: Negative for visual disturbance.       No diplopia or unilateral vision loss  Respiratory: Negative for cough, chest tightness  and shortness of breath.   Cardiovascular: Negative for chest pain, palpitations and leg swelling.  Gastrointestinal: Negative for nausea, vomiting, abdominal pain, constipation and blood in stool.       No heartburn  Genitourinary: Positive for urgency. Negative for frequency and difficulty urinating.       Urgency first after coffee and hyzaar No sexual problems  Musculoskeletal: Positive for back pain. Negative for joint swelling and arthralgias.       Strained back with sneeze  Skin: Negative for rash.       Has scattered little rough patches  Planning routine derm evaluation  Neurological: Negative for dizziness, syncope, weakness, light-headedness, numbness and headaches.  Hematological: Negative for adenopathy. Does not bruise/bleed easily.  Psychiatric/Behavioral: Negative for disturbed wake/sleep cycle and dysphoric mood. The patient is not nervous/anxious.        Objective:   Physical Exam  Constitutional: He is oriented to person, place, and time. He appears well-developed. No distress.  HENT:  Head: Normocephalic and atraumatic.  Right Ear:  External ear normal.  Left Ear: External ear normal.  Mouth/Throat: Oropharynx is clear and moist. No oropharyngeal exudate.  Eyes: Conjunctivae normal and EOM are normal. Pupils are equal, round, and reactive to light.  Neck: Normal range of motion. Neck supple. No thyromegaly present.  Cardiovascular: Normal rate, regular rhythm, normal heart sounds and intact distal pulses.  Exam reveals no gallop.   No murmur heard. Pulmonary/Chest: Effort normal and breath sounds normal. No respiratory distress. He has no wheezes. He has no rales.  Abdominal: Soft. There is no tenderness.  Musculoskeletal: He exhibits no edema and no tenderness.  Lymphadenopathy:    He has no cervical adenopathy.  Neurological: He is alert and oriented to person, place, and time.  Skin: No rash noted. No erythema.  Psychiatric: He has a normal mood and affect. His behavior is normal. Thought content normal.          Assessment & Plan:

## 2012-03-22 ENCOUNTER — Encounter: Payer: Self-pay | Admitting: Internal Medicine

## 2012-05-15 ENCOUNTER — Other Ambulatory Visit: Payer: Self-pay | Admitting: Internal Medicine

## 2012-05-21 ENCOUNTER — Other Ambulatory Visit: Payer: Self-pay | Admitting: Internal Medicine

## 2012-08-05 ENCOUNTER — Telehealth: Payer: Self-pay | Admitting: *Deleted

## 2012-08-05 NOTE — Telephone Encounter (Signed)
He may not do it if he hasn't seen him in 3 years When he is next due, I can fill out the form if I know his pressure

## 2012-08-05 NOTE — Telephone Encounter (Signed)
Patient called back and stated he already has his supplies and not sure why they are contacting Dr.Letvak, he said they need a prescription on file but not sure if Dr.Letvak can fill out the form. Should this be sent to Dr.Clance?

## 2012-08-05 NOTE — Telephone Encounter (Signed)
Ok, I will fax the form back to [facility].

## 2012-08-05 NOTE — Telephone Encounter (Signed)
Calling pt to ask about a form we received from [facility] about ordering some CPAP supplies and it looks like the last form was filled out by Dr. Shelle Iron at pulmonology on 08/19/08 pt last seen Dr.Clance on 09/24/08.  Marland Kitchenleft message to have patient return my call.  Also called Cecille Amsterdam at [facility] and left a message for her to return my call.

## 2013-03-28 ENCOUNTER — Encounter: Payer: 59 | Admitting: Internal Medicine

## 2013-05-22 ENCOUNTER — Other Ambulatory Visit: Payer: Self-pay | Admitting: Internal Medicine

## 2013-05-22 NOTE — Telephone Encounter (Signed)
rx sent to pharmacy by e-script  

## 2013-05-22 NOTE — Telephone Encounter (Signed)
Okay to refill for 6 months to get him to the PE

## 2013-05-22 NOTE — Telephone Encounter (Signed)
Ok to refill? Pt last seen 03/2012, pt canceled his 03/2013 CPX and rescheduled for 08/2013, please advise if we need to schedule sooner  ( he has canceled and rescheduled a lot of appointments)

## 2013-09-10 ENCOUNTER — Encounter: Payer: Self-pay | Admitting: Internal Medicine

## 2013-09-10 ENCOUNTER — Ambulatory Visit (INDEPENDENT_AMBULATORY_CARE_PROVIDER_SITE_OTHER): Payer: PRIVATE HEALTH INSURANCE | Admitting: Internal Medicine

## 2013-09-10 ENCOUNTER — Telehealth: Payer: Self-pay | Admitting: Internal Medicine

## 2013-09-10 VITALS — BP 128/86 | HR 80 | Temp 98.0°F | Ht 69.0 in | Wt 379.5 lb

## 2013-09-10 DIAGNOSIS — E785 Hyperlipidemia, unspecified: Secondary | ICD-10-CM

## 2013-09-10 DIAGNOSIS — Z125 Encounter for screening for malignant neoplasm of prostate: Secondary | ICD-10-CM

## 2013-09-10 DIAGNOSIS — Z23 Encounter for immunization: Secondary | ICD-10-CM

## 2013-09-10 DIAGNOSIS — G4733 Obstructive sleep apnea (adult) (pediatric): Secondary | ICD-10-CM

## 2013-09-10 DIAGNOSIS — I10 Essential (primary) hypertension: Secondary | ICD-10-CM

## 2013-09-10 DIAGNOSIS — K219 Gastro-esophageal reflux disease without esophagitis: Secondary | ICD-10-CM

## 2013-09-10 DIAGNOSIS — Z0000 Encounter for general adult medical examination without abnormal findings: Secondary | ICD-10-CM

## 2013-09-10 LAB — COMPREHENSIVE METABOLIC PANEL
ALT: 34 U/L (ref 0–53)
AST: 30 U/L (ref 0–37)
Albumin: 4 g/dL (ref 3.5–5.2)
Alkaline Phosphatase: 67 U/L (ref 39–117)
BILIRUBIN TOTAL: 1 mg/dL (ref 0.3–1.2)
BUN: 15 mg/dL (ref 6–23)
CALCIUM: 9.1 mg/dL (ref 8.4–10.5)
CHLORIDE: 101 meq/L (ref 96–112)
CO2: 30 meq/L (ref 19–32)
CREATININE: 0.8 mg/dL (ref 0.4–1.5)
GFR: 111.89 mL/min (ref >60.00)
Glucose, Bld: 99 mg/dL (ref 70–99)
Potassium: 4.3 mEq/L (ref 3.5–5.1)
SODIUM: 139 meq/L (ref 135–145)
TOTAL PROTEIN: 6.9 g/dL (ref 6.0–8.3)

## 2013-09-10 LAB — CBC WITH DIFFERENTIAL/PLATELET
BASOS ABS: 0 10*3/uL (ref 0.0–0.1)
Basophils Relative: 0.6 % (ref 0.0–3.0)
EOS ABS: 0.2 10*3/uL (ref 0.0–0.7)
Eosinophils Relative: 3 % (ref 0.0–5.0)
HEMATOCRIT: 44.6 % (ref 39.0–52.0)
HEMOGLOBIN: 14.7 g/dL (ref 13.0–17.0)
LYMPHS ABS: 2.5 10*3/uL (ref 0.7–4.0)
Lymphocytes Relative: 32 % (ref 12.0–46.0)
MCHC: 33 g/dL (ref 30.0–36.0)
MCV: 93.9 fl (ref 78.0–100.0)
Monocytes Absolute: 0.9 10*3/uL (ref 0.1–1.0)
Monocytes Relative: 10.9 % (ref 3.0–12.0)
NEUTROS ABS: 4.2 10*3/uL (ref 1.4–7.7)
Neutrophils Relative %: 53.5 % (ref 43.0–77.0)
PLATELETS: 240 10*3/uL (ref 150.0–400.0)
RBC: 4.75 Mil/uL (ref 4.22–5.81)
RDW: 14.4 % (ref 11.5–14.6)
WBC: 7.9 10*3/uL (ref 4.5–10.5)

## 2013-09-10 LAB — LIPID PANEL
CHOL/HDL RATIO: 5
Cholesterol: 185 mg/dL (ref 0–200)
HDL: 37.6 mg/dL — ABNORMAL LOW (ref >39.00)
LDL CALC: 130 mg/dL — AB (ref 0–99)
Triglycerides: 89 mg/dL (ref 0.0–149.0)
VLDL: 17.8 mg/dL (ref 0.0–40.0)

## 2013-09-10 LAB — TSH: TSH: 2.21 u[IU]/mL (ref 0.35–5.50)

## 2013-09-10 LAB — T4, FREE: FREE T4: 0.75 ng/dL (ref 0.60–1.60)

## 2013-09-10 LAB — PSA: PSA: 0.34 ng/mL (ref 0.10–4.00)

## 2013-09-10 NOTE — Telephone Encounter (Signed)
Relevant patient education mailed to patient.  

## 2013-09-10 NOTE — Assessment & Plan Note (Signed)
Healthy but still very out of shape Morbid obesity He is working on it Will check PSA after discussion Due for colon--he will set up

## 2013-09-10 NOTE — Assessment & Plan Note (Signed)
BP Readings from Last 3 Encounters:  09/10/13 128/86  03/15/12 132/90  07/27/11 120/74   Good control No changes needed

## 2013-09-10 NOTE — Progress Notes (Signed)
Pre visit review using our clinic review tool, if applicable. No additional management support is needed unless otherwise documented below in the visit note. 

## 2013-09-10 NOTE — Assessment & Plan Note (Signed)
Low HDL--discussed lifestyle

## 2013-09-10 NOTE — Addendum Note (Signed)
Addended by: Emelia Salisbury C on: 09/10/2013 09:52 AM   Modules accepted: Orders

## 2013-09-10 NOTE — Assessment & Plan Note (Signed)
Does well on CPAP

## 2013-09-10 NOTE — Patient Instructions (Signed)
Exercise to Lose Weight Exercise and a healthy diet may help you lose weight. Your doctor may suggest specific exercises. EXERCISE IDEAS AND TIPS  Choose low-cost things you enjoy doing, such as walking, bicycling, or exercising to workout videos.  Take stairs instead of the elevator.  Walk during your lunch break.  Park your car further away from work or school.  Go to a gym or an exercise class.  Start with 5 to 10 minutes of exercise each day. Build up to 30 minutes of exercise 4 to 6 days a week.  Wear shoes with good support and comfortable clothes.  Stretch before and after working out.  Work out until you breathe harder and your heart beats faster.  Drink extra water when you exercise.  Do not do so much that you hurt yourself, feel dizzy, or get very short of breath. Exercises that burn about 150 calories:  Running 1  miles in 15 minutes.  Playing volleyball for 45 to 60 minutes.  Washing and waxing a car for 45 to 60 minutes.  Playing touch football for 45 minutes.  Walking 1  miles in 35 minutes.  Pushing a stroller 1  miles in 30 minutes.  Playing basketball for 30 minutes.  Raking leaves for 30 minutes.  Bicycling 5 miles in 30 minutes.  Walking 2 miles in 30 minutes.  Dancing for 30 minutes.  Shoveling snow for 15 minutes.  Swimming laps for 20 minutes.  Walking up stairs for 15 minutes.  Bicycling 4 miles in 15 minutes.  Gardening for 30 to 45 minutes.  Jumping rope for 15 minutes.  Washing windows or floors for 45 to 60 minutes. Document Released: 07/22/2010 Document Revised: 09/11/2011 Document Reviewed: 07/22/2010 [facility] Patient Information 2014 [place], [place]. DASH Diet The DASH diet stands for "Dietary Approaches to Stop Hypertension." It is a healthy eating plan that has been shown to reduce high blood pressure (hypertension) in as little as 14 days, while also possibly providing other significant health benefits. These other  health benefits include reducing the risk of breast cancer after menopause and reducing the risk of type 2 diabetes, heart disease, colon cancer, and stroke. Health benefits also include weight loss and slowing kidney failure in patients with chronic kidney disease.  DIET GUIDELINES  Limit salt (sodium). Your diet should contain less than 1500 mg of sodium daily.  Limit refined or processed carbohydrates. Your diet should include mostly whole grains. Desserts and added sugars should be used sparingly.  Include small amounts of heart-healthy fats. These types of fats include nuts, oils, and tub margarine. Limit saturated and trans fats. These fats have been shown to be harmful in the body. CHOOSING FOODS  The following food groups are based on a 2000 calorie diet. See your Registered Dietitian for individual calorie needs. Grains and Grain Products (6 to 8 servings daily)  Eat More Often: Whole-wheat bread, brown rice, whole-grain or wheat pasta, quinoa, popcorn without added fat or salt (air popped).  Eat Less Often: White bread, white pasta, white rice, cornbread. Vegetables (4 to 5 servings daily)  Eat More Often: Fresh, frozen, and canned vegetables. Vegetables may be raw, steamed, roasted, or grilled with a minimal amount of fat.  Eat Less Often/Avoid: Creamed or fried vegetables. Vegetables in a cheese sauce. Fruit (4 to 5 servings daily)  Eat More Often: All fresh, canned (in natural juice), or frozen fruits. Dried fruits without added sugar. One hundred percent fruit juice ( cup [237 mL] daily).  Eat Less Often: Dried fruits with added sugar. Canned fruit in light or heavy syrup. [employer], Fish, and Poultry (2 servings or less daily. One serving is 3 to 4 oz [85-114 g]).  Eat More Often: Ninety percent or leaner ground beef, tenderloin, sirloin. Round cuts of beef, chicken breast, [place] breast. All fish. Grill, bake, or broil your meat. Nothing should be fried.  Eat Less  Often/Avoid: Fatty cuts of meat, [place], or chicken leg, thigh, or wing. Fried cuts of meat or fish. Dairy (2 to 3 servings)  Eat More Often: Low-fat or fat-free milk, low-fat plain or light yogurt, reduced-fat or part-skim cheese.  Eat Less Often/Avoid: Milk (whole, 2%).Whole milk yogurt. Full-fat cheeses. Nuts, Seeds, and Legumes (4 to 5 servings per week)  Eat More Often: All without added salt.  Eat Less Often/Avoid: Salted nuts and seeds, canned beans with added salt. Fats and Sweets (limited)  Eat More Often: Vegetable oils, tub margarines without trans fats, sugar-free gelatin. Mayonnaise and salad dressings.  Eat Less Often/Avoid: Coconut oils, palm oils, butter, stick margarine, cream, half and half, cookies, candy, pie. FOR MORE INFORMATION The Dash Diet Eating Plan: www.dashdiet.org Document Released: 06/08/2011 Document Revised: 09/11/2011 Document Reviewed: 06/08/2011 [facility] Patient Information 2014 [place], [place].

## 2013-09-10 NOTE — Progress Notes (Signed)
Subjective:    Patient ID: Jason Willis, male    DOB: 1961/01/05, 53 y.o.   MRN: [id number]  HPI Here for physical Had gained even more weight--- in the fall started working out at a gym. Goes every other day Has made some dietary changes Has lost some weight since then  Due for colonoscopy with Dr Henrene Pastor He will set this up  Current Outpatient Prescriptions on File Prior to Visit  Medication Sig Dispense Refill  . amLODipine (NORVASC) 5 MG tablet TAKE 1 TABLET BY MOUTH ONCE A DAY  30 tablet  5  . aspirin 81 MG tablet Take 81 mg by mouth daily.      . cetirizine (ZYRTEC) 10 MG chewable tablet Chew 10 mg by mouth. Take 1 by mouth once daily as needed       . glucosamine-chondroitin 500-400 MG tablet Take 1 tablet by mouth daily.      Marland Kitchen losartan-hydrochlorothiazide (HYZAAR) 100-25 MG per tablet TAKE 1 TABLET BY MOUTH ONCE A DAY  30 tablet  5  . multivitamin (THERAGRAN) per tablet Take 1 tablet by mouth daily.        . Omega-3 Fatty Acids (FISH OIL) 1000 MG CAPS Take by mouth. Take 1 capsule by mouth two times a day       . omeprazole (PRILOSEC OTC) 20 MG tablet Take 20 mg by mouth. Take 1 tablet by mouth once a day as needed for acid problems       . meclizine (ANTIVERT) 25 MG tablet Take 1 tablet (25 mg total) by mouth 3 (three) times daily as needed.  60 tablet  0   No current facility-administered medications on file prior to visit.    No Known Allergies  Past Medical History  Diagnosis Date  . Dyslipidemia     HDL low   . Familial benign hematuria   . Seborrheic dermatitis   . Sleep disorder     obstructive sleep apnea  . Hyperlipidemia   . Hypertension   . GERD (gastroesophageal reflux disease)   . Allergy   . Diverticulitis     No past surgical history on file.  Family History  Problem Relation Age of Onset  . Colon polyps Mother   . Anemia Mother   . COPD Father   . Hypertension Father   . Coronary artery disease Father   . Obesity Sister     gastric  bypass   . Diabetes Neg Hx     History   Social History  . Marital Status: Married    Spouse Name: N/A    Number of Children: 0  . Years of Education: N/A   Occupational History  . 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 Smoking status: Never Smoker   . Smokeless tobacco: Never Used  . Alcohol Use: Yes     Comment: occasionally  . Drug Use: Not on file  . Sexual Activity: Not on file   Other Topics Concern  . Not on file   Social History Narrative   Widowed then remarried 6/05   Review of Systems  Constitutional: Positive for unexpected weight change. Negative for fatigue.       Wears seat belt  HENT: Negative for dental problem, hearing loss and tinnitus.        Regular with dentist  Eyes: Negative for visual disturbance.  No diplopia or unilateral vision loss  Respiratory: Negative for cough, chest tightness and shortness of breath.   Cardiovascular: Negative for chest pain, palpitations and leg swelling.  Gastrointestinal: Negative for nausea, vomiting, abdominal pain, constipation and blood in stool.       No heartburn  Endocrine: Negative for cold intolerance and heat intolerance.  Genitourinary: Positive for urgency. Negative for frequency and difficulty urinating.       Some urgency after blood pressure med No sexual problems  Musculoskeletal: Negative for arthralgias, back pain and joint swelling.  Skin: Negative for rash.       No suspicious lesions Multiple skin tags  Allergic/Immunologic: Positive for environmental allergies. Negative for immunocompromised state.       Cetirizine works  Neurological: Negative for dizziness, syncope, weakness, light-headedness, numbness and headaches.  Psychiatric/Behavioral: Negative for sleep disturbance and dysphoric mood. The patient is not nervous/anxious.        Sleeps great with CPAP 18. Just got new one       Objective:    Physical Exam  Constitutional: He is oriented to person, place, and time. He appears well-developed and well-nourished. No distress.  HENT:  Head: Normocephalic and atraumatic.  Right Ear: External ear normal.  Left Ear: External ear normal.  Mouth/Throat: Oropharynx is clear and moist. No oropharyngeal exudate.  Eyes: Conjunctivae and EOM are normal. Pupils are equal, round, and reactive to light.  Neck: Normal range of motion. Neck supple. No thyromegaly present.  Cardiovascular: Normal rate, regular rhythm, normal heart sounds and intact distal pulses.  Exam reveals no gallop.   No murmur heard. Pulmonary/Chest: Effort normal and breath sounds normal. No respiratory distress. He has no wheezes. He has no rales.  Abdominal: Soft. There is no tenderness.  Musculoskeletal: He exhibits no edema and no tenderness.  Lymphadenopathy:    He has no cervical adenopathy.  Neurological: He is alert and oriented to person, place, and time.  Skin: No rash noted. No erythema.  Seborrheic keratosis on left cheek Multiple benign nevi (recommended derm evaluations)  Psychiatric: He has a normal mood and affect. His behavior is normal.          Assessment & Plan:

## 2013-09-10 NOTE — Assessment & Plan Note (Signed)
Quiet on PPI 

## 2013-11-22 ENCOUNTER — Other Ambulatory Visit: Payer: Self-pay | Admitting: Internal Medicine

## 2014-04-17 ENCOUNTER — Other Ambulatory Visit: Payer: Self-pay

## 2014-09-18 ENCOUNTER — Encounter: Payer: PRIVATE HEALTH INSURANCE | Admitting: Internal Medicine

## 2014-12-20 ENCOUNTER — Other Ambulatory Visit: Payer: Self-pay | Admitting: Internal Medicine

## 2014-12-22 ENCOUNTER — Telehealth: Payer: Self-pay

## 2014-12-22 NOTE — Telephone Encounter (Signed)
Examone left v/m that [facility] cannot find med record that was requested on 12/08/14. Order # [id number]. Spoke with amanda at [facility] and she was advised per Carrie's note that customer service at [facility] cannot see record until copied. Takes 14 - 16 business days once request sent to [facility]; Estill Bamberg will make notation.

## 2015-03-04 ENCOUNTER — Ambulatory Visit (INDEPENDENT_AMBULATORY_CARE_PROVIDER_SITE_OTHER): Payer: PRIVATE HEALTH INSURANCE | Admitting: Internal Medicine

## 2015-03-04 ENCOUNTER — Encounter: Payer: Self-pay | Admitting: Internal Medicine

## 2015-03-04 VITALS — BP 130/80 | HR 87 | Temp 97.8°F | Ht 69.0 in | Wt 384.0 lb

## 2015-03-04 DIAGNOSIS — Z23 Encounter for immunization: Secondary | ICD-10-CM | POA: Diagnosis not present

## 2015-03-04 DIAGNOSIS — Z0000 Encounter for general adult medical examination without abnormal findings: Secondary | ICD-10-CM | POA: Diagnosis not present

## 2015-03-04 DIAGNOSIS — E8881 Metabolic syndrome: Secondary | ICD-10-CM | POA: Diagnosis not present

## 2015-03-04 DIAGNOSIS — I10 Essential (primary) hypertension: Secondary | ICD-10-CM

## 2015-03-04 NOTE — Assessment & Plan Note (Signed)
He will work on [employer] and exercise

## 2015-03-04 NOTE — Assessment & Plan Note (Signed)
Healthy but obese Starting metabolic program at work Due for colon 1/17 Defer PSA till next year at least Flu vaccine

## 2015-03-04 NOTE — Assessment & Plan Note (Signed)
BP Readings from Last 3 Encounters:  03/04/15 130/80  09/10/13 128/86  03/15/12 132/90   Good control

## 2015-03-04 NOTE — Progress Notes (Signed)
Pre visit review using our clinic review tool, if applicable. No additional management support is needed unless otherwise documented below in the visit note. 

## 2015-03-04 NOTE — Addendum Note (Signed)
Addended by: Despina Hidden on: 03/04/2015 04:02 PM   Modules accepted: Orders

## 2015-03-04 NOTE — Progress Notes (Signed)
Subjective:    Patient ID: Jason Willis, male    DOB: [birth year], 54 y.o.   MRN: [id number]  HPI Here for physical Has been accepted into pilot program through work-- for body composition evaluation and tailored weight loss program Lost 10# and then gained it back (by cutting calories) Had been exercising with a friend--then stopped when he got hurt  Recent cholesterol at work of 121. LDL 65 Glucose normal  Current Outpatient Prescriptions on File Prior to Visit  Medication Sig Dispense Refill  . amLODipine (NORVASC) 5 MG tablet TAKE 1 TABLET BY MOUTH ONCE A DAY 90 tablet 0  . aspirin 81 MG tablet Take 81 mg by mouth daily.    . cetirizine (ZYRTEC) 10 MG chewable tablet Chew 10 mg by mouth. Take 1 by mouth once daily as needed     . glucosamine-chondroitin 500-400 MG tablet Take 1 tablet by mouth daily.    Marland Kitchen losartan-hydrochlorothiazide (HYZAAR) 100-25 MG per tablet TAKE 1 TABLET BY MOUTH ONCE A DAY 90 tablet 0  . multivitamin (THERAGRAN) per tablet Take 1 tablet by mouth daily.      . Omega-3 Fatty Acids (FISH OIL) 1000 MG CAPS Take by mouth. Take 1 capsule by mouth two times a day     . omeprazole (PRILOSEC OTC) 20 MG tablet Take 20 mg by mouth. Take 1 tablet by mouth once a day as needed for acid problems      No current facility-administered medications on file prior to visit.    No Known Allergies  Past Medical History  Diagnosis Date  . Dyslipidemia     HDL low   . Familial benign hematuria   . Seborrheic dermatitis   . Sleep disorder     obstructive sleep apnea  . Hyperlipidemia   . Hypertension   . GERD (gastroesophageal reflux disease)   . Allergy   . Diverticulitis     No past surgical history on file.  Family History  Problem Relation Age of Onset  . Colon polyps Mother   . Anemia Mother   . COPD Father   . Hypertension Father   . Coronary artery disease Father   . Obesity Sister     gastric bypass   . Diabetes Neg Hx     Social History    Social History  . Marital Status: Married    Spouse Name: N/A  . Number of Children: 0  . Years of Education: N/A   Occupational History  . 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 Smoking status: Never Smoker   . Smokeless tobacco: Never Used  . Alcohol Use: Yes     Comment: occasionally  . Drug Use: Not on file  . Sexual Activity: Not on file   Other Topics Concern  . Not on file   Social History Narrative   Widowed then remarried 6/05   Review of Systems  Constitutional: Positive for unexpected weight change. Negative for fatigue.       Wears seat belt  HENT: Negative for dental problem, hearing loss and tinnitus.        Keeps up with dentist  Eyes: Negative for visual disturbance.       No diplopia or unilateral vision loss  Respiratory: Negative for cough, chest tightness and shortness of breath.   Cardiovascular: Negative for chest pain, palpitations and leg swelling.  Gastrointestinal: Negative for  nausea, abdominal pain, constipation and blood in stool.       Heartburn controlled with prilosec Can skip doses   Endocrine: Negative for polydipsia and polyuria.  Genitourinary: Positive for urgency. Negative for frequency and difficulty urinating.       Some urgency after AM coffee No sexual problems  Musculoskeletal: Negative for back pain, joint swelling and arthralgias.  Skin: Negative for rash.       Scaly area right forearm in past few days---nonspecific so he will just watch (not actinic)  Allergic/Immunologic: Positive for environmental allergies. Negative for immunocompromised state.  Neurological: Negative for dizziness, syncope, weakness, light-headedness, numbness and headaches.  Hematological: Negative for adenopathy. Does not bruise/bleed easily.  Psychiatric/Behavioral: Negative for hallucinations and sleep disturbance. The patient is not nervous/anxious.        Sleeps  great with CPAP       Objective:   Physical Exam  Constitutional: He is oriented to person, place, and time. He appears well-developed and well-nourished. No distress.  HENT:  Head: Normocephalic and atraumatic.  Right Ear: External ear normal.  Left Ear: External ear normal.  Mouth/Throat: Oropharynx is clear and moist. No oropharyngeal exudate.  Eyes: Conjunctivae and EOM are normal. Pupils are equal, round, and reactive to light.  Neck: Normal range of motion. Neck supple. No thyromegaly present.  Cardiovascular: Normal rate, regular rhythm, normal heart sounds and intact distal pulses.  Exam reveals no gallop.   No murmur heard. Pulmonary/Chest: Effort normal and breath sounds normal. No respiratory distress. He has no wheezes. He has no rales.  Abdominal: Soft. There is no tenderness.  Musculoskeletal:  1+ non pitting edema  Lymphadenopathy:    He has no cervical adenopathy.  Neurological: He is alert and oriented to person, place, and time.  Skin: No rash noted. No erythema.  Psychiatric: He has a normal mood and affect. His behavior is normal.          Assessment & Plan:

## 2015-03-05 LAB — COMPREHENSIVE METABOLIC PANEL
ALT: 22 U/L (ref 0–53)
AST: 20 U/L (ref 0–37)
Albumin: 4.1 g/dL (ref 3.5–5.2)
Alkaline Phosphatase: 69 U/L (ref 39–117)
BILIRUBIN TOTAL: 0.4 mg/dL (ref 0.2–1.2)
BUN: 21 mg/dL (ref 6–23)
CO2: 32 mEq/L (ref 19–32)
Calcium: 9.1 mg/dL (ref 8.4–10.5)
Chloride: 103 mEq/L (ref 96–112)
Creatinine, Ser: 0.96 mg/dL (ref 0.40–1.50)
GFR: 86.27 mL/min (ref >60.00)
GLUCOSE: 87 mg/dL (ref 70–99)
Potassium: 3.8 mEq/L (ref 3.5–5.1)
SODIUM: 143 meq/L (ref 135–145)
TOTAL PROTEIN: 7.4 g/dL (ref 6.0–8.3)

## 2015-03-05 LAB — CBC WITH DIFFERENTIAL/PLATELET
BASOS ABS: 0.1 10*3/uL (ref 0.0–0.1)
Basophils Relative: 1 % (ref 0.0–3.0)
EOS ABS: 0.2 10*3/uL (ref 0.0–0.7)
Eosinophils Relative: 2 % (ref 0.0–5.0)
HCT: 45.5 % (ref 39.0–52.0)
HEMOGLOBIN: 15 g/dL (ref 13.0–17.0)
LYMPHS ABS: 3.1 10*3/uL (ref 0.7–4.0)
Lymphocytes Relative: 28.3 % (ref 12.0–46.0)
MCHC: 33 g/dL (ref 30.0–36.0)
MCV: 93.9 fl (ref 78.0–100.0)
MONO ABS: 1.3 10*3/uL — AB (ref 0.1–1.0)
Monocytes Relative: 11.7 % (ref 3.0–12.0)
NEUTROS PCT: 57 % (ref 43.0–77.0)
Neutro Abs: 6.3 10*3/uL (ref 1.4–7.7)
Platelets: 268 10*3/uL (ref 150.0–400.0)
RBC: 4.85 Mil/uL (ref 4.22–5.81)
RDW: 14.1 % (ref 11.5–15.5)
WBC: 11 10*3/uL — ABNORMAL HIGH (ref 4.0–10.5)

## 2015-03-05 LAB — T4, FREE: FREE T4: 0.76 ng/dL (ref 0.60–1.60)

## 2015-03-21 ENCOUNTER — Other Ambulatory Visit: Payer: Self-pay | Admitting: Internal Medicine

## 2015-09-08 ENCOUNTER — Encounter: Payer: Self-pay | Admitting: Internal Medicine

## 2015-11-01 ENCOUNTER — Telehealth: Payer: Self-pay | Admitting: *Deleted

## 2015-11-01 NOTE — Telephone Encounter (Signed)
  Will forward to Rosanne Sack rn, Dr Blanch Media nurse. Thanks Lelan Pons PV

## 2015-11-01 NOTE — Telephone Encounter (Signed)
Chart reviewed. Direct at [facility] is fine. Please schedule when I have a hospital half block (but not during my hospital week). Also, my [facility] half-[facility] is full so would have to be some Hospital half-day block thereafter. Thanks

## 2015-11-01 NOTE — Telephone Encounter (Signed)
Dr Henrene Pastor, This pt has a recall colon for screening scheduled with you 5-19, Friday in the [facility].  His last colon was 07-31-2005 with you for acute diverticulitis and had polyps and tics. Tissue for the polyp was not sufficient for Dx per lab report. He has a medical hx of OSA, GERD, HTN, and allergies. His last weight 03-2015 was 384.0lb and BMI was 56.7. Do you want him to have an Ov or can he be direct at [facility]? Please advise, Thanks for your time, Marijean Niemann

## 2015-11-03 NOTE — Telephone Encounter (Signed)
Left message for pt to call back  °

## 2015-11-04 ENCOUNTER — Other Ambulatory Visit: Payer: Self-pay

## 2015-11-04 DIAGNOSIS — Z1211 Encounter for screening for malignant neoplasm of colon: Secondary | ICD-10-CM

## 2015-11-04 NOTE — Telephone Encounter (Signed)
Pt scheduled for Colon with mac at [facility] 01/25/16@9 :30am. Pt aware of appt. Pt coming for previsit 11/05/15@9am .

## 2015-11-19 ENCOUNTER — Encounter: Payer: PRIVATE HEALTH INSURANCE | Admitting: Internal Medicine

## 2016-01-07 ENCOUNTER — Ambulatory Visit (AMBULATORY_SURGERY_CENTER): Payer: Self-pay | Admitting: *Deleted

## 2016-01-07 VITALS — Ht 70.0 in | Wt 355.6 lb

## 2016-01-07 DIAGNOSIS — Z1211 Encounter for screening for malignant neoplasm of colon: Secondary | ICD-10-CM

## 2016-01-07 MED ORDER — NA SULFATE-K SULFATE-MG SULF 17.5-3.13-1.6 GM/177ML PO SOLN: ORAL | Status: DC

## 2016-01-07 NOTE — Progress Notes (Signed)
No allergies to eggs or soy. No prior anesthesia.  Pt given Emmi instructions for colonoscopy  No oxygen use  No diet drug use  

## 2016-01-18 ENCOUNTER — Telehealth: Payer: Self-pay | Admitting: Internal Medicine

## 2016-01-19 ENCOUNTER — Other Ambulatory Visit: Payer: Self-pay

## 2016-01-19 DIAGNOSIS — Z1211 Encounter for screening for malignant neoplasm of colon: Secondary | ICD-10-CM

## 2016-01-19 NOTE — Telephone Encounter (Signed)
Pts colon rescheduled to [facility] 03/21/16@9 :30am, pt need to arrive there at 8am. Pt aware of appt date and time and prep instructions.

## 2016-03-10 ENCOUNTER — Encounter: Payer: Self-pay | Admitting: Internal Medicine

## 2016-03-10 ENCOUNTER — Ambulatory Visit (INDEPENDENT_AMBULATORY_CARE_PROVIDER_SITE_OTHER): Payer: PRIVATE HEALTH INSURANCE | Admitting: Internal Medicine

## 2016-03-10 VITALS — BP 122/88 | HR 69 | Temp 97.8°F | Ht 69.25 in | Wt 354.0 lb

## 2016-03-10 DIAGNOSIS — G4733 Obstructive sleep apnea (adult) (pediatric): Secondary | ICD-10-CM | POA: Diagnosis not present

## 2016-03-10 DIAGNOSIS — Z1159 Encounter for screening for other viral diseases: Secondary | ICD-10-CM | POA: Diagnosis not present

## 2016-03-10 DIAGNOSIS — Z23 Encounter for immunization: Secondary | ICD-10-CM | POA: Diagnosis not present

## 2016-03-10 DIAGNOSIS — Z0000 Encounter for general adult medical examination without abnormal findings: Secondary | ICD-10-CM

## 2016-03-10 DIAGNOSIS — I10 Essential (primary) hypertension: Secondary | ICD-10-CM | POA: Diagnosis not present

## 2016-03-10 DIAGNOSIS — Z125 Encounter for screening for malignant neoplasm of prostate: Secondary | ICD-10-CM | POA: Diagnosis not present

## 2016-03-10 LAB — CBC WITH DIFFERENTIAL/PLATELET
BASOS PCT: 0.4 % (ref 0.0–3.0)
Basophils Absolute: 0 10*3/uL (ref 0.0–0.1)
EOS ABS: 0.2 10*3/uL (ref 0.0–0.7)
EOS PCT: 2.1 % (ref 0.0–5.0)
HEMATOCRIT: 44.7 % (ref 39.0–52.0)
HEMOGLOBIN: 15.1 g/dL (ref 13.0–17.0)
LYMPHS PCT: 31.7 % (ref 12.0–46.0)
Lymphs Abs: 2.5 10*3/uL (ref 0.7–4.0)
MCHC: 33.9 g/dL (ref 30.0–36.0)
MCV: 91.3 fl (ref 78.0–100.0)
MONO ABS: 0.9 10*3/uL (ref 0.1–1.0)
Monocytes Relative: 12.2 % — ABNORMAL HIGH (ref 3.0–12.0)
NEUTROS ABS: 4.1 10*3/uL (ref 1.4–7.7)
Neutrophils Relative %: 53.6 % (ref 43.0–77.0)
PLATELETS: 252 10*3/uL (ref 150.0–400.0)
RBC: 4.9 Mil/uL (ref 4.22–5.81)
RDW: 14.2 % (ref 11.5–15.5)
WBC: 7.7 10*3/uL (ref 4.0–10.5)

## 2016-03-10 LAB — COMPREHENSIVE METABOLIC PANEL
ALT: 21 U/L (ref 0–53)
AST: 18 U/L (ref 0–37)
Albumin: 4.1 g/dL (ref 3.5–5.2)
Alkaline Phosphatase: 64 U/L (ref 39–117)
BUN: 16 mg/dL (ref 6–23)
CHLORIDE: 102 meq/L (ref 96–112)
CO2: 34 meq/L — AB (ref 19–32)
CREATININE: 0.79 mg/dL (ref 0.40–1.50)
Calcium: 8.9 mg/dL (ref 8.4–10.5)
GFR: 107.63 mL/min (ref >60.00)
Glucose, Bld: 94 mg/dL (ref 70–99)
POTASSIUM: 4.1 meq/L (ref 3.5–5.1)
SODIUM: 140 meq/L (ref 135–145)
Total Bilirubin: 0.8 mg/dL (ref 0.2–1.2)
Total Protein: 6.9 g/dL (ref 6.0–8.3)

## 2016-03-10 LAB — LIPID PANEL
CHOLESTEROL: 174 mg/dL (ref 0–200)
HDL: 38.9 mg/dL — ABNORMAL LOW (ref >39.00)
LDL CALC: 113 mg/dL — AB (ref 0–99)
NONHDL: 135.54
Total CHOL/HDL Ratio: 4
Triglycerides: 111 mg/dL (ref 0.0–149.0)
VLDL: 22.2 mg/dL (ref 0.0–40.0)

## 2016-03-10 LAB — PSA: PSA: 0.43 ng/mL (ref 0.10–4.00)

## 2016-03-10 NOTE — Progress Notes (Signed)
Subjective:    Patient ID: Jason Willis, male    DOB: 10-Aug-1960, 55 y.o.   MRN: [id number]  HPI Here for physical  Has colonoscopy scheduled for next week Still travels a lot--daily in region  Has lost 30# since last year Using [employer] Has wellness coach --originally through work and he has continued it He is okay with current eating approach  Current Outpatient Prescriptions on File Prior to Visit  Medication Sig Dispense Refill  . amLODipine (NORVASC) 5 MG tablet TAKE 1 TABLET BY MOUTH ONCE A DAY 90 tablet 3  . aspirin 81 MG tablet Take 81 mg by mouth daily.    . cetirizine (ZYRTEC) 10 MG chewable tablet Chew 10 mg by mouth. Take 1 by mouth once daily as needed     . glucosamine-chondroitin 500-400 MG tablet Take 1 tablet by mouth daily.    Marland Kitchen losartan-hydrochlorothiazide (HYZAAR) 100-25 MG per tablet TAKE 1 TABLET BY MOUTH ONCE A DAY 90 tablet 3  . multivitamin (THERAGRAN) per tablet Take 1 tablet by mouth daily.      . Na Sulfate-K Sulfate-Mg Sulf (SUPREP BOWEL PREP KIT) 17.5-3.13-1.6 GM/180ML SOLN suprep as directed.  No substitutions 354 mL 0  . Omega-3 Fatty Acids (FISH OIL) 1000 MG CAPS Take by mouth. Take 1 capsule by mouth two times a day     . omeprazole (PRILOSEC OTC) 20 MG tablet Take 20 mg by mouth. Take 1 tablet by mouth once a day as needed for acid problems      No current facility-administered medications on file prior to visit.     No Known Allergies  Past Medical History:  Diagnosis Date  . Allergy   . Diverticulitis   . Dyslipidemia    HDL low   . Familial benign hematuria   . GERD (gastroesophageal reflux disease)   . Hyperlipidemia   . Hypertension   . Seborrheic dermatitis   . Sleep apnea    cpap  . Sleep disorder    obstructive sleep apnea    Past Surgical History:  Procedure Laterality Date  . NO PAST SURGERIES      Family History  Problem Relation Age of Onset  . COPD Father   . Hypertension Father   . Coronary artery  disease Father   . Colon polyps Mother   . Anemia Mother   . Stroke Mother   . Obesity Sister     gastric bypass   . Cancer Paternal Uncle     2 uncles-- liver and another with new diagnosis (?lung)  . Diabetes Neg Hx   . Colon cancer Neg Hx     Social History   Social History  . Marital status: Married    Spouse name: N/A  . Number of children: 0  . Years of education: N/A   Occupational History  . 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 Smoking status: Never Smoker  . Smokeless tobacco: Never Used  . Alcohol use 0.0 oz/week     Comment: rare  . Drug use: No  . Sexual activity: Not on file   Other Topics Concern  . Not on file   Social History Narrative   Widowed then remarried 6/05   Review of Systems  Constitutional: Negative for fatigue.       Wears seat belt  HENT: Negative for dental problem, hearing loss and tinnitus.  Keeps up with dentist  Eyes: Negative for visual disturbance.       No diplopia or unilateral vision loss  Respiratory: Negative for cough, chest tightness and shortness of breath.   Cardiovascular: Negative for chest pain, palpitations and leg swelling.  Gastrointestinal: Negative for abdominal pain, blood in stool, constipation, nausea and vomiting.  Endocrine: Negative for polydipsia and polyuria.  Genitourinary: Negative for frequency and urgency.       Rare dribbling No sexual problems  Musculoskeletal: Positive for arthralgias. Negative for back pain and joint swelling.       DIP pain in left 2nd/3rd fingers  Skin:       Has spot on stomach that needs to be checked Also skin tag left eyelid Early actinic on right forearm---will go to derm for all this  Allergic/Immunologic: Positive for environmental allergies. Negative for immunocompromised state.       Zyrtec helps  Neurological: Negative for dizziness, syncope, weakness, light-headedness and  headaches.  Hematological: Negative for adenopathy. Does not bruise/bleed easily.  Psychiatric/Behavioral: Negative for dysphoric mood and sleep disturbance. The patient is not nervous/anxious.        Objective:   Physical Exam  Constitutional: He is oriented to person, place, and time. He appears well-developed and well-nourished. No distress.  HENT:  Head: Normocephalic and atraumatic.  Right Ear: External ear normal.  Left Ear: External ear normal.  Mouth/Throat: Oropharynx is clear and moist. No oropharyngeal exudate.  Eyes: Conjunctivae are normal. Pupils are equal, round, and reactive to light.  Neck: Normal range of motion. Neck supple. No thyromegaly present.  Cardiovascular: Normal rate, regular rhythm, normal heart sounds and intact distal pulses.  Exam reveals no gallop.   No murmur heard. Pulmonary/Chest: Effort normal and breath sounds normal. No respiratory distress. He has no wheezes. He has no rales.  Abdominal: Soft. There is no tenderness.  Musculoskeletal: He exhibits no edema or tenderness.  Lymphadenopathy:    He has no cervical adenopathy.  Neurological: He is alert and oriented to person, place, and time.  Skin:  Multiple benign nevi  Psychiatric: He has a normal mood and affect. His behavior is normal.          Assessment & Plan:

## 2016-03-10 NOTE — Assessment & Plan Note (Signed)
BP Readings from Last 3 Encounters:  03/10/16 122/88  03/04/15 130/80  09/10/13 128/86   Doing well No changes needed

## 2016-03-10 NOTE — Addendum Note (Signed)
Addended by: Pilar Grammes on: 03/10/2016 03:31 PM   Modules accepted: Orders

## 2016-03-10 NOTE — Addendum Note (Signed)
Addended by: Ellamae Sia on: 03/10/2016 12:41 PM   Modules accepted: Orders

## 2016-03-10 NOTE — Assessment & Plan Note (Signed)
Uses this every night with excellent compliance 18cm H2O Has extra machines so he can use it with travel etc

## 2016-03-10 NOTE — Assessment & Plan Note (Signed)
Doing well Major strides in weight loss and working on fitness Colonoscopy next week Will check PSA after discussion Flu vaccine today

## 2016-03-10 NOTE — Progress Notes (Signed)
Pre visit review using our clinic review tool, if applicable. No additional management support is needed unless otherwise documented below in the visit note. 

## 2016-03-11 LAB — HEPATITIS C ANTIBODY: HCV Ab: NEGATIVE

## 2016-03-16 ENCOUNTER — Telehealth: Payer: Self-pay | Admitting: Internal Medicine

## 2016-03-16 NOTE — Telephone Encounter (Signed)
Pts procedure rescheduled at [facility] for 06/20/16@8 :30am. Pt aware of appt.

## 2016-03-25 ENCOUNTER — Other Ambulatory Visit: Payer: Self-pay | Admitting: Internal Medicine

## 2016-06-19 ENCOUNTER — Encounter (HOSPITAL_COMMUNITY): Payer: Self-pay

## 2016-06-19 NOTE — Anesthesia Preprocedure Evaluation (Addendum)
Anesthesia Evaluation  Patient identified by MRN, date of birth, ID band Patient awake    Reviewed: Allergy & Precautions, H&P , Patient's Chart, lab work & pertinent test results, reviewed documented beta blocker date and time   Airway Mallampati: II  TM Distance: >3 FB Neck ROM: full    Dental no notable dental hx.    Pulmonary    Pulmonary exam normal breath sounds clear to auscultation       Cardiovascular hypertension,  Rhythm:regular Rate:Normal     Neuro/Psych    GI/Hepatic GERD  ,  Endo/Other  Morbid obesity  Renal/GU      Musculoskeletal   Abdominal   Peds  Hematology   Anesthesia Other Findings HTN Super Morbid Obesity OSA  Reproductive/Obstetrics                             Anesthesia Physical Anesthesia Plan  ASA: III  Anesthesia Plan:    Post-op Pain Management:    Induction: Intravenous  Airway Management Planned: Mask  Additional Equipment:   Intra-op Plan:   Post-operative Plan:   Informed Consent: I have reviewed the patients History and Physical, chart, labs and discussed the procedure including the risks, benefits and alternatives for the proposed anesthesia with the patient or authorized representative who has indicated his/her understanding and acceptance.   Dental Advisory Given and Dental advisory given  Plan Discussed with: CRNA and Surgeon  Anesthesia Plan Comments: (Discussed GA with LMA, possible sore throat, potential need to switch to ETT, N/V, pulmonary aspiration. Questions answered. )       Anesthesia Quick Evaluation

## 2016-06-20 ENCOUNTER — Ambulatory Visit (HOSPITAL_COMMUNITY): Payer: No Typology Code available for payment source | Admitting: Anesthesiology

## 2016-06-20 ENCOUNTER — Encounter (HOSPITAL_COMMUNITY): Payer: Self-pay

## 2016-06-20 ENCOUNTER — Ambulatory Visit (HOSPITAL_COMMUNITY)
Admission: RE | Admit: 2016-06-20 | Discharge: 2016-06-20 | Disposition: A | Discharge status: Home / Self Care | Payer: No Typology Code available for payment source | Source: Ambulatory Visit | Attending: Internal Medicine | Admitting: Internal Medicine

## 2016-06-20 ENCOUNTER — Encounter (HOSPITAL_COMMUNITY)
Admission: RE | Disposition: A | Discharge status: Home / Self Care | Payer: Self-pay | Source: Ambulatory Visit | Attending: Internal Medicine

## 2016-06-20 DIAGNOSIS — D124 Benign neoplasm of descending colon: Secondary | ICD-10-CM | POA: Diagnosis not present

## 2016-06-20 DIAGNOSIS — Z1212 Encounter for screening for malignant neoplasm of rectum: Secondary | ICD-10-CM

## 2016-06-20 DIAGNOSIS — K648 Other hemorrhoids: Secondary | ICD-10-CM | POA: Diagnosis not present

## 2016-06-20 DIAGNOSIS — D123 Benign neoplasm of transverse colon: Secondary | ICD-10-CM

## 2016-06-20 DIAGNOSIS — K5730 Diverticulosis of large intestine without perforation or abscess without bleeding: Secondary | ICD-10-CM | POA: Diagnosis not present

## 2016-06-20 DIAGNOSIS — Z1211 Encounter for screening for malignant neoplasm of colon: Secondary | ICD-10-CM | POA: Diagnosis not present

## 2016-06-20 DIAGNOSIS — D128 Benign neoplasm of rectum: Secondary | ICD-10-CM | POA: Diagnosis not present

## 2016-06-20 HISTORY — PX: COLONOSCOPY: SHX5424

## 2016-06-20 SURGERY — COLONOSCOPY
Anesthesia: Monitor Anesthesia Care

## 2016-06-20 MED ORDER — LIDOCAINE 2% (20 MG/ML) 5 ML SYRINGE
INTRAMUSCULAR | Status: AC
  Filled 2016-06-20: qty 5

## 2016-06-20 MED ORDER — PROPOFOL 10 MG/ML IV BOLUS
INTRAVENOUS | Status: AC
  Filled 2016-06-20: qty 40

## 2016-06-20 MED ORDER — FENTANYL CITRATE (PF) 100 MCG/2ML IJ SOLN: 25.0000 ug | INTRAMUSCULAR | Status: DC | PRN

## 2016-06-20 MED ORDER — PROPOFOL 500 MG/50ML IV EMUL
INTRAVENOUS | Status: DC | PRN
  Administered 2016-06-20: 140 ug/kg/min via INTRAVENOUS

## 2016-06-20 MED ORDER — PROPOFOL 10 MG/ML IV BOLUS
INTRAVENOUS | Status: AC
  Filled 2016-06-20: qty 20

## 2016-06-20 MED ORDER — LACTATED RINGERS IV SOLN
INTRAVENOUS | Status: DC
  Administered 2016-06-20: 1000 mL via INTRAVENOUS

## 2016-06-20 NOTE — Op Note (Signed)
[facility] Patient Name: Jason Willis Procedure Date: 06/20/2016 MRN: [id number] Attending MD: Docia Chuck. Henrene Pastor , MD Date of Birth: April 26, 1961 CSN: [id number] Age: 55 Admit Type: Outpatient Procedure:                Colonoscopy, with cold snare polypectomy x 3 Indications:              Screening for colorectal malignant neoplasm.                            Negative index exam 2007 Providers:                Docia Chuck. Henrene Pastor, MD, Dustin Flock RN, RN, Cherylynn Ridges, Technician, Danley Danker, CRNA Referring MD:             Self Medicines:                Monitored Anesthesia Care Complications:            No immediate complications. Estimated blood loss:                            None. Estimated Blood Loss:     Estimated blood loss: none. Procedure:                Pre-Anesthesia Assessment:                           - Prior to the procedure, a History and Physical                            was performed, and patient medications and                            allergies were reviewed. The patient's tolerance of                            previous anesthesia was also reviewed. The risks                            and benefits of the procedure and the sedation                            options and risks were discussed with the patient.                            All questions were answered, and informed consent                            was obtained. Prior Anticoagulants: The patient has                            taken no previous anticoagulant or antiplatelet  agents. ASA Grade Assessment: II - A patient with                            mild systemic disease. After reviewing the risks                            and benefits, the patient was deemed in                            satisfactory condition to undergo the procedure.                           After obtaining informed consent, the colonoscope       was passed under direct vision. Throughout the                            procedure, the patient's blood pressure, pulse, and                            oxygen saturations were monitored continuously. The                            [id number] scope was introduced through                            the anus and advanced to the the cecum, identified                            by appendiceal orifice and ileocecal valve. The                            ileocecal valve, appendiceal orifice, and rectum                            were photographed. The quality of the bowel                            preparation was adequate to identify polyps. The                            colonoscopy was performed without difficulty. The                            patient tolerated the procedure well. The bowel                            preparation used was SUPREP. Scope In: 8:55:29 AM Scope Out: 9:18:52 AM Scope Withdrawal Time: 0 hours 19 minutes 10 seconds  Total Procedure Duration: 0 hours 23 minutes 23 seconds  Findings:      Three polyps were found in the rectum, descending colon and distal       transverse colon. The polyps were 3 to 5 mm in size.      Multiple small and large-mouthed diverticula were found in the  distal       transverse colon and left colon.      Internal hemorrhoids were found during retroflexion.      The exam was otherwise without abnormality on direct and retroflexion       views. Impression:               - Three 3 to 5 mm polyps in the rectum, in the                            descending colon and in the distal transverse colon.                           - Diverticulosis in the distal transverse colon and                            in the left colon.                           - Internal hemorrhoids.                           - The examination was otherwise normal on direct                            and retroflexion views.                           - No specimens  collected. Moderate Sedation:      none Recommendation:           - Repeat colonoscopy in 5 years for surveillance,                            if any polyps adenomatous.                           - Patient has a contact number available for                            emergencies. The signs and symptoms of potential                            delayed complications were discussed with the                            patient. Return to normal activities tomorrow.                            Written discharge instructions were provided to the                            patient.                           - Resume previous diet.                           -  Continue present medications.                           - Await pathology results. Procedure Code(s):        --- Professional ---                           [phone], Colonoscopy, flexible; diagnostic, including                            collection of specimen(s) by brushing or washing,                            when performed (separate procedure) Diagnosis Code(s):        --- Professional ---                           Z12.11, Encounter for screening for malignant                            neoplasm of colon                           K62.1, Rectal polyp                           D12.4, Benign neoplasm of descending colon                           D12.3, Benign neoplasm of transverse colon (hepatic                            flexure or splenic flexure)                           K64.8, Other hemorrhoids                           K57.30, Diverticulosis of large intestine without                            perforation or abscess without bleeding CPT copyright 2016 American Medical Association. All rights reserved. The codes documented in this report are preliminary and upon coder review may  be revised to meet current compliance requirements. Docia Chuck. Henrene Pastor, MD 06/20/2016 9:23:07 AM This report has been signed electronically. Number of Addenda:  0

## 2016-06-20 NOTE — Anesthesia Postprocedure Evaluation (Signed)
Anesthesia Post Note  Patient: Bryne Wiessner  Procedure(s) Performed: Procedure(s) (LRB): COLONOSCOPY (N/A)  Patient location during evaluation: PACU Anesthesia Type: MAC Level of consciousness: awake and alert Pain management: pain level controlled Vital Signs Assessment: post-procedure vital signs reviewed and stable Respiratory status: spontaneous breathing, nonlabored ventilation, respiratory function stable and patient connected to nasal cannula oxygen Cardiovascular status: stable and blood pressure returned to baseline Anesthetic complications: no       Last Vitals:  Vitals:   06/20/16 0940 06/20/16 0950  BP: 125/66 (!) 151/72  Pulse: 70 64  Resp: 15 10  Temp:      Last Pain:  Vitals:   06/20/16 0926  TempSrc: Oral                 Yalexa Blust EDWARD

## 2016-06-20 NOTE — Transfer of Care (Signed)
Immediate Anesthesia Transfer of Care Note  Patient: Jason Willis  Procedure(s) Performed: Procedure(s): COLONOSCOPY (N/A)  Patient Location: Endoscopy Unit  Anesthesia Type:MAC  Level of Consciousness: awake, alert  and oriented  Airway & Oxygen Therapy: Patient Spontanous Breathing and Patient connected to face mask oxygen  Post-op Assessment: Report given to RN and Post -op Vital signs reviewed and stable  Post vital signs: Reviewed and stable  Last Vitals:  Vitals:   06/20/16 0743  BP: (!) 175/89  Pulse: 72  Resp: 14  Temp: 36.8 C    Last Pain:  Vitals:   06/20/16 0743  TempSrc: Oral         Complications: No apparent anesthesia complications

## 2016-06-20 NOTE — Anesthesia Postprocedure Evaluation (Signed)
Anesthesia Post Note  Patient: Jason Willis  Procedure(s) Performed: Procedure(s) (LRB): COLONOSCOPY (N/A)  Patient location during evaluation: PACU Anesthesia Type: MAC Level of consciousness: awake and alert Pain management: pain level controlled Vital Signs Assessment: post-procedure vital signs reviewed and stable Respiratory status: spontaneous breathing, nonlabored ventilation, respiratory function stable and patient connected to nasal cannula oxygen Cardiovascular status: stable and blood pressure returned to baseline Anesthetic complications: no       Last Vitals:  Vitals:   06/20/16 0940 06/20/16 0950  BP: 125/66 (!) 151/72  Pulse: 70 64  Resp: 15 10  Temp:      Last Pain:  Vitals:   06/20/16 0926  TempSrc: Oral                 Carlo Guevarra EDWARD

## 2016-06-20 NOTE — Discharge Instructions (Signed)
YOU HAD AN ENDOSCOPIC PROCEDURE TODAY: Refer to the procedure report and other information in the discharge instructions given to you for any specific questions about what was found during the examination. If this information does not answer your questions, please call [facility] office at [phone] to clarify.  ° °YOU SHOULD EXPECT: Some feelings of bloating in the abdomen. Passage of more gas than usual. Walking can help get rid of the air that was put into your GI tract during the procedure and reduce the bloating. If you had a lower endoscopy (such as a colonoscopy or flexible sigmoidoscopy) you may notice spotting of blood in your stool or on the toilet paper. Some abdominal soreness may be present for a day or two, also. ° °DIET: Your first meal following the procedure should be a light meal and then it is ok to progress to your normal diet. A half-sandwich or bowl of soup is an example of a good first meal. Heavy or fried foods are harder to digest and may make you feel nauseous or bloated. Drink plenty of fluids but you should avoid alcoholic beverages for 24 hours. If you had a esophageal dilation, please see attached instructions for diet.   ° °ACTIVITY: Your care partner should take you home directly after the procedure. You should plan to take it easy, moving slowly for the rest of the day. You can resume normal activity the day after the procedure however YOU SHOULD NOT DRIVE, use power tools, machinery or perform tasks that involve climbing or major physical exertion for 24 hours (because of the sedation medicines used during the test).  ° °SYMPTOMS TO REPORT IMMEDIATELY: °A gastroenterologist can be reached at any hour. Please call [phone]  for any of the following symptoms:  °Following lower endoscopy (colonoscopy, flexible sigmoidoscopy) °Excessive amounts of blood in the stool  °Significant tenderness, worsening of abdominal pains  °Swelling of the abdomen that is new, acute  °Fever of 100° or  higher  °Following upper endoscopy (EGD, EUS, ERCP, esophageal dilation) °Vomiting of blood or coffee ground material  °New, significant abdominal pain  °New, significant chest pain or pain under the shoulder blades  °Painful or persistently difficult swallowing  °New shortness of breath  °Black, tarry-looking or red, bloody stools ° °FOLLOW UP:  °If any biopsies were taken you will be contacted by phone or by letter within the next 1-3 weeks. Call [phone]  if you have not heard about the biopsies in 3 weeks.  °Please also call with any specific questions about appointments or follow up tests. ° °

## 2016-06-20 NOTE — H&P (Signed)
HISTORY OF PRESENT ILLNESS:  Jason Willis is a 55 y.o. male presents for routine screening colonoscopy. Last examination 2007 was negative for neoplasia. Examination being performed at the hospital with MAC secondary to morbid obesity  REVIEW OF SYSTEMS:  All non-GI ROS negative except for  Past Medical History:  Diagnosis Date  . Allergy   . Diverticulitis   . Dyslipidemia    HDL low   . GERD (gastroesophageal reflux disease)   . Hyperlipidemia   . Hypertension   . Seborrheic dermatitis   . Sleep apnea    cpap  . Sleep disorder    obstructive sleep apnea    Past Surgical History:  Procedure Laterality Date  . NO PAST SURGERIES      Social History [name]  reports that he has never smoked. He has never used smokeless tobacco. He reports that he drinks alcohol. He reports that he does not use drugs.  family history includes Anemia in his mother; COPD in his father; Cancer in his paternal uncle; Colon polyps in his mother; Coronary artery disease in his father; Hypertension in his father; Obesity in his sister; Stroke in his mother.  No Known Allergies     PHYSICAL EXAMINATION: Vital signs: BP (!) 175/89   Pulse 72   Temp 98.3 F (36.8 C) (Oral)   Resp 14   Ht 5\' 9"  (1.753 m)   Wt (!) 354 lb (160.6 kg)   SpO2 93%   BMI 52.28 kg/m   Constitutional:Obese, generally well-appearing, no acute distress Psychiatric: alert and oriented x3, cooperative Eyes: extraocular movements intact, anicteric, conjunctiva pink Mouth: oral pharynx moist, no lesions Neck: supple no lymphadenopathy Cardiovascular: heart regular rate and rhythm, no murmur Lungs: clear to auscultation bilaterally Abdomen: soft, these, nontender, nondistended, no obvious ascites, no peritoneal signs, normal bowel sounds, no organomegaly Rectal:See colonoscopy report Extremities: no lower extremity edema bilaterally Skin: no lesions on visible extremities Neuro: No focal  deficits.  ASSESSMENT:  1. Colon cancer screening. Average cancer risk. Above average procedure risk   PLAN:  1. Screening colonoscopy.The nature of the procedure, as well as the risks, benefits, and alternatives were carefully and thoroughly reviewed with the patient. Ample time for discussion and questions allowed. The patient understood, was satisfied, and agreed to proceed.

## 2016-06-21 ENCOUNTER — Encounter (HOSPITAL_COMMUNITY): Payer: Self-pay | Admitting: Internal Medicine

## 2016-06-22 ENCOUNTER — Encounter: Payer: Self-pay | Admitting: Internal Medicine

## 2016-06-24 ENCOUNTER — Encounter (HOSPITAL_COMMUNITY): Payer: Self-pay | Admitting: Internal Medicine

## 2016-06-24 NOTE — Addendum Note (Signed)
Addendum  created 06/24/16 0553 by Lyndle Herrlich, MD   SmartForm saved

## 2016-06-29 NOTE — Op Note (Signed)
ADDENDUM TO COLONOSCOPY REPORT FROM 06-20-16 (inadevertant omission).  THE THREE POLYPS LISTED IN THE REPORT WERE REMOVED WITH COLD SNARE TECHNIQUE AND SUBMITTED TO PATHOLOGY  Docia Chuck. Geri Seminole., M.D. [facility] Division of Gastroenterology

## 2017-02-12 ENCOUNTER — Encounter: Payer: Self-pay | Admitting: Internal Medicine

## 2017-02-12 NOTE — Telephone Encounter (Signed)
Please prepare letter for him stating he uses it nightly with good compliance. I did note this at his last physical. See if the company would prefer a copy of my note

## 2017-02-16 ENCOUNTER — Other Ambulatory Visit: Payer: Self-pay | Admitting: Internal Medicine

## 2017-03-16 ENCOUNTER — Encounter: Payer: PRIVATE HEALTH INSURANCE | Admitting: Internal Medicine

## 2017-03-17 ENCOUNTER — Other Ambulatory Visit: Payer: Self-pay | Admitting: Internal Medicine

## 2017-04-15 ENCOUNTER — Other Ambulatory Visit: Payer: Self-pay | Admitting: Internal Medicine

## 2017-05-11 ENCOUNTER — Other Ambulatory Visit: Payer: Self-pay | Admitting: Internal Medicine

## 2017-05-14 ENCOUNTER — Other Ambulatory Visit: Payer: Self-pay | Admitting: Internal Medicine

## 2017-07-27 ENCOUNTER — Ambulatory Visit (INDEPENDENT_AMBULATORY_CARE_PROVIDER_SITE_OTHER): Payer: No Typology Code available for payment source | Admitting: Internal Medicine

## 2017-07-27 ENCOUNTER — Encounter: Payer: Self-pay | Admitting: Internal Medicine

## 2017-07-27 VITALS — BP 130/82 | HR 81 | Temp 97.8°F | Ht 69.0 in | Wt 383.0 lb

## 2017-07-27 DIAGNOSIS — Z23 Encounter for immunization: Secondary | ICD-10-CM | POA: Diagnosis not present

## 2017-07-27 DIAGNOSIS — I10 Essential (primary) hypertension: Secondary | ICD-10-CM

## 2017-07-27 DIAGNOSIS — G4733 Obstructive sleep apnea (adult) (pediatric): Secondary | ICD-10-CM

## 2017-07-27 DIAGNOSIS — Z0000 Encounter for general adult medical examination without abnormal findings: Secondary | ICD-10-CM

## 2017-07-27 DIAGNOSIS — K219 Gastro-esophageal reflux disease without esophagitis: Secondary | ICD-10-CM

## 2017-07-27 LAB — COMPREHENSIVE METABOLIC PANEL
ALBUMIN: 3.9 g/dL (ref 3.5–5.2)
ALT: 22 U/L (ref 0–53)
AST: 17 U/L (ref 0–37)
Alkaline Phosphatase: 57 U/L (ref 39–117)
BUN: 21 mg/dL (ref 6–23)
CALCIUM: 9 mg/dL (ref 8.4–10.5)
CHLORIDE: 101 meq/L (ref 96–112)
CO2: 31 mEq/L (ref 19–32)
CREATININE: 0.76 mg/dL (ref 0.40–1.50)
GFR: 112 mL/min (ref >60.00)
Glucose, Bld: 108 mg/dL — ABNORMAL HIGH (ref 70–99)
Potassium: 3.6 mEq/L (ref 3.5–5.1)
SODIUM: 140 meq/L (ref 135–145)
Total Bilirubin: 0.6 mg/dL (ref 0.2–1.2)
Total Protein: 7 g/dL (ref 6.0–8.3)

## 2017-07-27 LAB — CBC
HEMATOCRIT: 42.8 % (ref 39.0–52.0)
Hemoglobin: 14.3 g/dL (ref 13.0–17.0)
MCHC: 33.4 g/dL (ref 30.0–36.0)
MCV: 92.2 fl (ref 78.0–100.0)
Platelets: 241 10*3/uL (ref 150.0–400.0)
RBC: 4.64 Mil/uL (ref 4.22–5.81)
RDW: 14.2 % (ref 11.5–15.5)
WBC: 6.4 10*3/uL (ref 4.0–10.5)

## 2017-07-27 NOTE — Assessment & Plan Note (Signed)
BP Readings from Last 3 Encounters:  07/27/17 130/82  06/20/16 (!) 151/72  03/10/16 122/88   Good control

## 2017-07-27 NOTE — Assessment & Plan Note (Signed)
Increase zantac Back to omeprazole if not effective

## 2017-07-27 NOTE — Assessment & Plan Note (Signed)
Sleeps well with CPAP --uses every night Has 4 machines ---travel, home, camper, spare

## 2017-07-27 NOTE — Progress Notes (Signed)
Subjective:    Patient ID: Jason Willis, male    DOB: 05-15-1961, 57 y.o.   MRN: [id number]  HPI Here for physical  Stopped the omeprazole due to side effect concerns It worked great On zantac 150 daily---not as good Will try bid first  Still on the road a lot Gained back his weight---- "my fault" Only exercises once a week--- treadmill, bicycle for an hour  Current Outpatient Medications on File Prior to Visit  Medication Sig Dispense Refill  . amLODipine (NORVASC) 5 MG tablet TAKE 1 TABLET BY MOUTH ONCE A DAY 90 tablet 0  . aspirin 81 MG tablet Take 81 mg by mouth daily.    . cetirizine (ZYRTEC) 10 MG chewable tablet Chew 10 mg by mouth. Take 1 by mouth once daily as needed     . glucosamine-chondroitin 500-400 MG tablet Take 1 tablet by mouth daily.    Marland Kitchen losartan-hydrochlorothiazide (HYZAAR) 100-25 MG tablet TAKE 1 TABLET BY MOUTH EVERY DAY 30 tablet 2  . multivitamin (THERAGRAN) per tablet Take 1 tablet by mouth daily.      . Omega-3 Fatty Acids (FISH OIL) 1000 MG CAPS Take by mouth. Take 1 capsule by mouth two times a day     . ranitidine (ZANTAC) 150 MG tablet Take 150 mg by mouth daily.     No current facility-administered medications on file prior to visit.     No Known Allergies  Past Medical History:  Diagnosis Date  . Allergy   . Diverticulitis   . Dyslipidemia    HDL low   . GERD (gastroesophageal reflux disease)   . Hyperlipidemia   . Hypertension   . Seborrheic dermatitis   . Sleep apnea    cpap  . Sleep disorder    obstructive sleep apnea    Past Surgical History:  Procedure Laterality Date  . COLONOSCOPY N/A 06/20/2016   Procedure: COLONOSCOPY;  Surgeon: Irene Shipper, MD;  Location: [facility];  Service: Endoscopy;  Laterality: N/A;  . NO PAST SURGERIES      Family History  Problem Relation Age of Onset  . COPD Father   . Hypertension Father   . Coronary artery disease Father   . Colon polyps Mother   . Anemia Mother   . Stroke Mother    . Obesity Sister        gastric bypass   . Cancer Paternal Uncle        2 uncles-- liver and another with new diagnosis (?lung)  . Diabetes Neg Hx   . Colon cancer Neg Hx     Social History   Socioeconomic History  . Marital status: Married    Spouse name: Not on file  . Number of children: 0  . Years of education: Not on file  . Highest education level: Not on file  Social Needs  . Financial resource strain: Not on file  . Food insecurity - worry: Not on file  . Food insecurity - inability: Not on file  . Transportation needs - medical: Not on file  . Transportation needs - non-medical: Not on file  Occupational History  . 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 Smoking status: Never Smoker  . Smokeless tobacco: Never Used  Substance and Sexual Activity  . Alcohol use: Yes    Alcohol/week: 0.0 oz    Comment: rare  . Drug use: No  . Sexual activity:  Not on file  Other Topics Concern  . Not on file  Social History Narrative   Widowed then remarried 6/05   Review of Systems  Constitutional: Positive for unexpected weight change. Negative for fatigue.       Wears seat belt  HENT: Negative for dental problem, hearing loss, tinnitus and trouble swallowing.        Keeps up with dentist  Eyes: Negative for visual disturbance.       No diplopia or unilateral vision loss  Respiratory: Negative for cough, chest tightness and shortness of breath.        Does get DOE when he pushes hard  Cardiovascular: Negative for chest pain, palpitations and leg swelling.  Gastrointestinal: Negative for abdominal pain, blood in stool and constipation.  Endocrine: Negative for polydipsia and polyuria.  Genitourinary: Negative for difficulty urinating.       Urgency after coffee No sexual problems  Musculoskeletal: Negative for back pain and joint swelling.       Twisted knee in snow---better now  Skin: Negative  for rash.       1 spot on belly to be checked  Allergic/Immunologic: Positive for environmental allergies. Negative for immunocompromised state.       Satisfied With cetirizine  Neurological: Negative for dizziness, syncope, light-headedness and headaches.  Hematological: Negative for adenopathy. Does not bruise/bleed easily.  Psychiatric/Behavioral: Negative for dysphoric mood and sleep disturbance. The patient is not nervous/anxious.        Objective:   Physical Exam  Constitutional: He is oriented to person, place, and time. No distress.  HENT:  Head: Normocephalic and atraumatic.  Right Ear: External ear normal.  Left Ear: External ear normal.  Mouth/Throat: Oropharynx is clear and moist. No oropharyngeal exudate.  Eyes: Conjunctivae are normal. Pupils are equal, round, and reactive to light.  Neck: No thyromegaly present.  Cardiovascular: Normal rate, regular rhythm, normal heart sounds and intact distal pulses. Exam reveals no gallop.  No murmur heard. Pulmonary/Chest: Effort normal and breath sounds normal. No respiratory distress. He has no wheezes. He has no rales.  Abdominal: Soft. He exhibits no distension. There is no tenderness. There is no rebound and no guarding.  Musculoskeletal: He exhibits no edema or tenderness.  Lymphadenopathy:    He has no cervical adenopathy.  Neurological: He is alert and oriented to person, place, and time.  Skin: No rash noted. No erythema.  seb keratosis on abd Multiple nevi  Psychiatric: He has a normal mood and affect. His behavior is normal.          Assessment & Plan:

## 2017-07-27 NOTE — Patient Instructions (Signed)
Increase the ranitidine to 150mg  twice a day. If that doesn't control your symptoms, go back to the omeprazole--but try to take it only every other day.

## 2017-07-27 NOTE — Assessment & Plan Note (Signed)
Healthy but let himself go Work on fitness Colon due 2022 Defer PSA till next year

## 2017-08-11 ENCOUNTER — Other Ambulatory Visit: Payer: Self-pay | Admitting: Internal Medicine

## 2017-08-14 ENCOUNTER — Other Ambulatory Visit: Payer: Self-pay | Admitting: Internal Medicine

## 2018-07-25 ENCOUNTER — Other Ambulatory Visit: Payer: Self-pay | Admitting: Internal Medicine

## 2018-08-02 ENCOUNTER — Encounter: Payer: No Typology Code available for payment source | Admitting: Internal Medicine

## 2018-08-16 ENCOUNTER — Encounter: Payer: No Typology Code available for payment source | Admitting: Internal Medicine

## 2018-10-04 ENCOUNTER — Encounter: Payer: No Typology Code available for payment source | Admitting: Internal Medicine

## 2018-10-07 ENCOUNTER — Other Ambulatory Visit: Payer: Self-pay | Admitting: Internal Medicine

## 2018-12-30 ENCOUNTER — Other Ambulatory Visit: Payer: Self-pay | Admitting: Internal Medicine

## 2019-03-21 ENCOUNTER — Encounter: Payer: Self-pay | Admitting: Internal Medicine

## 2019-03-21 ENCOUNTER — Ambulatory Visit (INDEPENDENT_AMBULATORY_CARE_PROVIDER_SITE_OTHER): Payer: PRIVATE HEALTH INSURANCE | Admitting: Internal Medicine

## 2019-03-21 ENCOUNTER — Other Ambulatory Visit: Payer: Self-pay

## 2019-03-21 VITALS — BP 138/88 | HR 84 | Temp 98.4°F | Ht 69.0 in | Wt 384.0 lb

## 2019-03-21 DIAGNOSIS — I10 Essential (primary) hypertension: Secondary | ICD-10-CM

## 2019-03-21 DIAGNOSIS — Z125 Encounter for screening for malignant neoplasm of prostate: Secondary | ICD-10-CM | POA: Diagnosis not present

## 2019-03-21 DIAGNOSIS — Z23 Encounter for immunization: Secondary | ICD-10-CM

## 2019-03-21 DIAGNOSIS — Z0000 Encounter for general adult medical examination without abnormal findings: Secondary | ICD-10-CM | POA: Diagnosis not present

## 2019-03-21 DIAGNOSIS — K219 Gastro-esophageal reflux disease without esophagitis: Secondary | ICD-10-CM

## 2019-03-21 LAB — COMPREHENSIVE METABOLIC PANEL
ALT: 25 U/L (ref 0–53)
AST: 19 U/L (ref 0–37)
Albumin: 4.1 g/dL (ref 3.5–5.2)
Alkaline Phosphatase: 67 U/L (ref 39–117)
BUN: 16 mg/dL (ref 6–23)
CO2: 30 mEq/L (ref 19–32)
Calcium: 9.1 mg/dL (ref 8.4–10.5)
Chloride: 102 mEq/L (ref 96–112)
Creatinine, Ser: 0.8 mg/dL (ref 0.40–1.50)
GFR: 98.76 mL/min (ref >60.00)
Glucose, Bld: 102 mg/dL — ABNORMAL HIGH (ref 70–99)
Potassium: 3.9 mEq/L (ref 3.5–5.1)
Sodium: 140 mEq/L (ref 135–145)
Total Bilirubin: 0.6 mg/dL (ref 0.2–1.2)
Total Protein: 6.7 g/dL (ref 6.0–8.3)

## 2019-03-21 LAB — CBC
HCT: 43.1 % (ref 39.0–52.0)
Hemoglobin: 14.5 g/dL (ref 13.0–17.0)
MCHC: 33.5 g/dL (ref 30.0–36.0)
MCV: 92 fl (ref 78.0–100.0)
Platelets: 264 10*3/uL (ref 150.0–400.0)
RBC: 4.69 Mil/uL (ref 4.22–5.81)
RDW: 13.8 % (ref 11.5–15.5)
WBC: 8.2 10*3/uL (ref 4.0–10.5)

## 2019-03-21 LAB — PSA: PSA: 0.24 ng/mL (ref 0.10–4.00)

## 2019-03-21 MED ORDER — TRIAMCINOLONE ACETONIDE 0.1 % EX CREA: 1.0000 "application " | TOPICAL_CREAM | Freq: Two times a day (BID) | CUTANEOUS | 1 refills | Status: DC | PRN

## 2019-03-21 NOTE — Assessment & Plan Note (Signed)
Discussed trying famotidine

## 2019-03-21 NOTE — Patient Instructions (Signed)
You can try famotidine (pepcid) over the counter--- 20-40mg  at bedtime or twice a day---instead of the omeprazole.

## 2019-03-21 NOTE — Assessment & Plan Note (Signed)
BP Readings from Last 3 Encounters:  03/21/19 138/88  07/27/17 130/82  06/20/16 (!) 151/72   Good control Due for labs

## 2019-03-21 NOTE — Assessment & Plan Note (Signed)
Healthy but obese Flu vaccine today Will check PSA after discussion Colon due 2022

## 2019-03-21 NOTE — Assessment & Plan Note (Addendum)
Discussed reasonable goals--like losing 10# per year Does calorie counting

## 2019-03-21 NOTE — Progress Notes (Signed)
Subjective:    Patient ID: Jason Willis, male    DOB: Jun 05, 1961, 58 y.o.   MRN: [id number]  HPI Here for physical Busier than ever with his work Continues to be on the road every day--- 5:15AM--7PM (4 days per week) Brings food Has been walking---and will be heading back to the gym now  Has a knot on his right jaw Noticed about a week ago Has fatty feel--no pain  Has some bladder leakage Urge only--mostly if can't get to bathroom in time  Ranitidine was doing fine--then off market Now back on omeprazole Discussed he can try famotidine  Rash on right foot Tried some athlete's foot spray--helped a little  Current Outpatient Medications on File Prior to Visit  Medication Sig Dispense Refill  . amLODipine (NORVASC) 5 MG tablet TAKE 1 TABLET BY MOUTH EVERY DAY 90 tablet 3  . aspirin 81 MG tablet Take 81 mg by mouth daily.    . cetirizine (ZYRTEC) 10 MG chewable tablet Chew 10 mg by mouth. Take 1 by mouth once daily as needed     . glucosamine-chondroitin 500-400 MG tablet Take 1 tablet by mouth daily.    Marland Kitchen losartan-hydrochlorothiazide (HYZAAR) 100-25 MG tablet TAKE 1 TABLET BY MOUTH EVERY DAY 90 tablet 1  . multivitamin (THERAGRAN) per tablet Take 1 tablet by mouth daily.      . Omega-3 Fatty Acids (FISH OIL) 1000 MG CAPS Take by mouth. Take 1 capsule by mouth two times a day     . omeprazole (PRILOSEC) 20 MG capsule Take 20 mg by mouth daily.     No current facility-administered medications on file prior to visit.     No Known Allergies  Past Medical History:  Diagnosis Date  . Allergy   . Diverticulitis   . Dyslipidemia    HDL low   . GERD (gastroesophageal reflux disease)   . Hyperlipidemia   . Hypertension   . Seborrheic dermatitis   . Sleep apnea    cpap  . Sleep disorder    obstructive sleep apnea    Past Surgical History:  Procedure Laterality Date  . COLONOSCOPY N/A 06/20/2016   Procedure: COLONOSCOPY;  Surgeon: Irene Shipper, MD;  Location: [facility]  ENDOSCOPY;  Service: Endoscopy;  Laterality: N/A;  . NO PAST SURGERIES      Family History  Problem Relation Age of Onset  . COPD Father   . Hypertension Father   . Coronary artery disease Father   . Colon polyps Mother   . Anemia Mother   . Stroke Mother   . Obesity Sister        gastric bypass   . Multiple sclerosis Sister   . Cancer Paternal Uncle        2 uncles-- liver and another with new diagnosis (?lung)  . Diabetes Neg Hx   . Colon cancer Neg Hx     Social History   Socioeconomic History  . Marital status: Married    Spouse name: Not on file  . Number of children: 0  . Years of education: Not on file  . Highest education level: Not on file  Occupational History  . 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 Financial resource strain: Not on file  . Food insecurity    Worry: Not on file    Inability: Not on file  . Transportation needs    Medical: Not on file  Non-medical: Not on file  Tobacco Use  . Smoking status: Never Smoker  . Smokeless tobacco: Never Used  Substance and Sexual Activity  . Alcohol use: Yes    Alcohol/week: 0.0 standard drinks    Comment: rare  . Drug use: No  . Sexual activity: Not on file  Lifestyle  . Physical activity    Days per week: Not on file    Minutes per session: Not on file  . Stress: Not on file  Relationships  . Social Herbalist on phone: Not on file    Gets together: Not on file    Attends religious service: Not on file    Active member of club or organization: Not on file    Attends meetings of clubs or organizations: Not on file    Relationship status: Not on file  . Intimate partner violence    Fear of current or ex partner: Not on file    Emotionally abused: Not on file    Physically abused: Not on file    Forced sexual activity: Not on file  Other Topics Concern  . Not on file  Social History Narrative   Widowed  then remarried 6/05   Review of Systems  Constitutional: Negative for fatigue and unexpected weight change.       Wears seat belt  HENT: Negative for dental problem, hearing loss, tinnitus and trouble swallowing.        Keeps up with dentist  Eyes: Negative for visual disturbance.       No diplopia or unilateral vision loss  Respiratory: Negative for cough, chest tightness and shortness of breath.   Cardiovascular: Negative for chest pain, palpitations and leg swelling.  Gastrointestinal: Negative for blood in stool and constipation.  Endocrine: Negative for polydipsia and polyuria.  Genitourinary: Positive for urgency. Negative for difficulty urinating.       Flow is fine No sexual problems  Musculoskeletal: Negative for back pain and joint swelling.       Some left knee pain---chronic injury from years ago  Skin:       No suspicious skin lesions  Allergic/Immunologic: Negative for environmental allergies and immunocompromised state.  Neurological: Negative for dizziness, syncope, light-headedness and headaches.  Hematological: Negative for adenopathy. Does not bruise/bleed easily.  Psychiatric/Behavioral: Negative for dysphoric mood and sleep disturbance. The patient is not nervous/anxious.        Uses the CPAP every day---even has generator for when the power went out       Objective:   Physical Exam  Constitutional: He is oriented to person, place, and time. No distress.  HENT:  Head: Normocephalic and atraumatic.  Right Ear: External ear normal.  Left Ear: External ear normal.  Mouth/Throat: Oropharynx is clear and moist.  Slightly irregular, freely moveable mass in right preauricular area (~1.5 x 0.5 cm)---feels fatty. Discussed ENT evaluation if gets larger  Eyes: Pupils are equal, round, and reactive to light. Conjunctivae are normal.  Neck: No thyromegaly present.  Cardiovascular: Normal rate, regular rhythm, normal heart sounds and intact distal pulses. Exam reveals  no gallop.  No murmur heard. Respiratory: Effort normal and breath sounds normal. No respiratory distress. He has no wheezes. He has no rales.  GI: Soft. There is no abdominal tenderness.  Musculoskeletal:        General: No tenderness or edema.  Lymphadenopathy:    He has no cervical adenopathy.  Neurological: He is alert and oriented to person, place, and time.  Skin:  Slight scaly lesion on top of right foot Numerous hyperpigmented lesions. 1 7-71mm oval lesions in mid back Recommended establishing with dermatologist  Psychiatric: He has a normal mood and affect. His behavior is normal.           Assessment & Plan:

## 2019-03-31 ENCOUNTER — Other Ambulatory Visit: Payer: Self-pay | Admitting: Internal Medicine

## 2019-12-31 ENCOUNTER — Other Ambulatory Visit: Payer: Self-pay | Admitting: Internal Medicine

## 2020-03-26 ENCOUNTER — Encounter: Payer: PRIVATE HEALTH INSURANCE | Admitting: Internal Medicine

## 2020-04-03 ENCOUNTER — Other Ambulatory Visit: Payer: Self-pay | Admitting: Internal Medicine

## 2020-07-16 ENCOUNTER — Encounter: Payer: PRIVATE HEALTH INSURANCE | Admitting: Internal Medicine

## 2020-09-01 ENCOUNTER — Other Ambulatory Visit: Payer: Self-pay

## 2020-09-01 ENCOUNTER — Encounter: Payer: Self-pay | Admitting: Internal Medicine

## 2020-09-01 ENCOUNTER — Ambulatory Visit (INDEPENDENT_AMBULATORY_CARE_PROVIDER_SITE_OTHER): Payer: PRIVATE HEALTH INSURANCE | Admitting: Internal Medicine

## 2020-09-01 VITALS — BP 140/90 | HR 82 | Temp 97.4°F | Ht 69.0 in | Wt 385.0 lb

## 2020-09-01 DIAGNOSIS — I10 Essential (primary) hypertension: Secondary | ICD-10-CM

## 2020-09-01 DIAGNOSIS — Z0000 Encounter for general adult medical examination without abnormal findings: Secondary | ICD-10-CM

## 2020-09-01 DIAGNOSIS — Z125 Encounter for screening for malignant neoplasm of prostate: Secondary | ICD-10-CM | POA: Diagnosis not present

## 2020-09-01 DIAGNOSIS — R32 Unspecified urinary incontinence: Secondary | ICD-10-CM | POA: Diagnosis not present

## 2020-09-01 DIAGNOSIS — Z23 Encounter for immunization: Secondary | ICD-10-CM

## 2020-09-01 DIAGNOSIS — R221 Localized swelling, mass and lump, neck: Secondary | ICD-10-CM | POA: Insufficient documentation

## 2020-09-01 DIAGNOSIS — K219 Gastro-esophageal reflux disease without esophagitis: Secondary | ICD-10-CM

## 2020-09-01 DIAGNOSIS — G4733 Obstructive sleep apnea (adult) (pediatric): Secondary | ICD-10-CM

## 2020-09-01 LAB — COMPREHENSIVE METABOLIC PANEL
ALT: 24 U/L (ref 0–53)
AST: 19 U/L (ref 0–37)
Albumin: 4 g/dL (ref 3.5–5.2)
Alkaline Phosphatase: 65 U/L (ref 39–117)
BUN: 16 mg/dL (ref 6–23)
CO2: 31 mEq/L (ref 19–32)
Calcium: 9.1 mg/dL (ref 8.4–10.5)
Chloride: 101 mEq/L (ref 96–112)
Creatinine, Ser: 0.81 mg/dL (ref 0.40–1.50)
GFR: 95.5 mL/min (ref >60.00)
Glucose, Bld: 136 mg/dL — ABNORMAL HIGH (ref 70–99)
Potassium: 3.9 mEq/L (ref 3.5–5.1)
Sodium: 138 mEq/L (ref 135–145)
Total Bilirubin: 0.6 mg/dL (ref 0.2–1.2)
Total Protein: 6.9 g/dL (ref 6.0–8.3)

## 2020-09-01 LAB — LIPID PANEL
Cholesterol: 166 mg/dL (ref 0–200)
HDL: 41.8 mg/dL (ref >39.00)
LDL Cholesterol: 99 mg/dL (ref 0–99)
NonHDL: 123.94
Total CHOL/HDL Ratio: 4
Triglycerides: 126 mg/dL (ref 0.0–149.0)
VLDL: 25.2 mg/dL (ref 0.0–40.0)

## 2020-09-01 LAB — POC URINALSYSI DIPSTICK (AUTOMATED)
Bilirubin, UA: NEGATIVE
Blood, UA: NEGATIVE
Glucose, UA: NEGATIVE
Ketones, UA: NEGATIVE
Leukocytes, UA: NEGATIVE
Nitrite, UA: NEGATIVE
Protein, UA: NEGATIVE
Spec Grav, UA: 1.01 (ref 1.010–1.025)
Urobilinogen, UA: 0.2 E.U./dL
pH, UA: 6.5 (ref 5.0–8.0)

## 2020-09-01 LAB — CBC
HCT: 44.9 % (ref 39.0–52.0)
Hemoglobin: 15 g/dL (ref 13.0–17.0)
MCHC: 33.3 g/dL (ref 30.0–36.0)
MCV: 92.4 fl (ref 78.0–100.0)
Platelets: 263 10*3/uL (ref 150.0–400.0)
RBC: 4.86 Mil/uL (ref 4.22–5.81)
RDW: 14 % (ref 11.5–15.5)
WBC: 7.7 10*3/uL (ref 4.0–10.5)

## 2020-09-01 MED ORDER — SEMAGLUTIDE 3 MG PO TABS: 1.0000 | ORAL_TABLET | Freq: Every day | ORAL | 0 refills | Status: DC

## 2020-09-01 MED ORDER — SEMAGLUTIDE 7 MG PO TABS: 1.0000 | ORAL_TABLET | Freq: Every day | ORAL | 11 refills | Status: DC

## 2020-09-01 NOTE — Assessment & Plan Note (Signed)
BP Readings from Last 3 Encounters:  09/01/20 140/90  03/21/19 138/88  07/27/17 130/82   Good control on amlodipine and losartan/HCTZ

## 2020-09-01 NOTE — Assessment & Plan Note (Signed)
Seems mobile and non tender Gland vs node Will set up for ENT to look at this

## 2020-09-01 NOTE — Progress Notes (Signed)
Subjective:    Patient ID: Jason Willis, male    DOB: April 25, 1961, 60 y.o.   MRN: [id number]  HPI Here for physical This visit occurred during the SARS-CoV-2 public health emergency.  Safety protocols were in place, including screening questions prior to the visit, additional usage of staff PPE, and extensive cleaning of exam room while observing appropriate contact time as indicated for disinfecting solutions.   Wife is concerned about dark urine Mostly at night--he does limit his fluids in the evening Has worsening of urge incontinence---just leaking. Hasn't needed pad as yet Nocturia is rare---once a week or so  Has a knot on right jaw Small on left and bigger on right----points near angle of mandible (parotid?)  Had mild COVID in January No residual symptoms  Not as much time on the road Mostly in [place] now--so less in the car Working on healthy eating---tracking all his intake Not much exercise yet  Continues on BP meds  Current Outpatient Medications on File Prior to Visit  Medication Sig Dispense Refill  . amLODipine (NORVASC) 5 MG tablet TAKE 1 TABLET BY MOUTH EVERY DAY 90 tablet 3  . aspirin 81 MG tablet Take 81 mg by mouth daily.    . cetirizine (ZYRTEC) 10 MG chewable tablet Chew 10 mg by mouth. Take 1 by mouth once daily as needed    . famotidine (PEPCID) 10 MG tablet Take 10 mg by mouth daily.    Marland Kitchen glucosamine-chondroitin 500-400 MG tablet Take 1 tablet by mouth daily.    Marland Kitchen losartan-hydrochlorothiazide (HYZAAR) 100-25 MG tablet TAKE 1 TABLET BY MOUTH EVERY DAY 90 tablet 3  . multivitamin (THERAGRAN) per tablet Take 1 tablet by mouth daily.    . Omega-3 Fatty Acids (FISH OIL) 1000 MG CAPS Take by mouth. Take 1 capsule by mouth two times a day    . triamcinolone cream (KENALOG) 0.1 % Apply 1 application topically 2 (two) times daily as needed. 45 g 1   No current facility-administered medications on file prior to visit.    No Known Allergies  Past Medical  History:  Diagnosis Date  . Allergy   . Diverticulitis   . Dyslipidemia    HDL low   . GERD (gastroesophageal reflux disease)   . Hyperlipidemia   . Hypertension   . Seborrheic dermatitis   . Sleep apnea    cpap  . Sleep disorder    obstructive sleep apnea    Past Surgical History:  Procedure Laterality Date  . COLONOSCOPY N/A 06/20/2016   Procedure: COLONOSCOPY;  Surgeon: Irene Shipper, MD;  Location: [facility];  Service: Endoscopy;  Laterality: N/A;  . NO PAST SURGERIES      Family History  Problem Relation Age of Onset  . COPD Father   . Hypertension Father   . Coronary artery disease Father   . Colon polyps Mother   . Anemia Mother   . Stroke Mother   . Obesity Sister        gastric bypass   . Multiple sclerosis Sister   . Cancer Paternal Uncle        2 uncles-- liver and another with new diagnosis (?lung)  . Diabetes Neg Hx   . Colon cancer Neg Hx     Social History   Socioeconomic History  . Marital status: Married    Spouse name: Not on file  . Number of children: 0  . Years of education: Not on file  . Highest education level: Not  on file  Occupational History  . 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 Smoking status: Never Smoker  . Smokeless tobacco: Never Used  Substance and Sexual Activity  . Alcohol use: Yes    Alcohol/week: 0.0 standard drinks    Comment: rare  . Drug use: No  . Sexual activity: Not on file  Other Topics Concern  . Not on file  Social History Narrative   Widowed then remarried 6/05   Social Determinants of Health   Financial Resource Strain: Not on file  Food Insecurity: Not on file  Transportation Needs: Not on file  Physical Activity: Not on file  Stress: Not on file  Social Connections: Not on file  Intimate Partner Violence: Not on file   Review of Systems  Constitutional: Negative for fatigue and unexpected weight change.        Wears seat belt  HENT: Negative for dental problem, hearing loss and trouble swallowing.        Gets crackling in ears at times Keeps up with dentist  Eyes: Negative for visual disturbance.       No diplopia or unilateral vision loss  Respiratory: Negative for cough, chest tightness and shortness of breath.   Cardiovascular: Negative for chest pain, palpitations and leg swelling.  Gastrointestinal: Negative for blood in stool and constipation.       Heartburn controlled with famotidine  Endocrine: Negative for polydipsia and polyuria.  Genitourinary: Positive for urgency. Negative for dysuria.  Musculoskeletal: Negative for back pain and joint swelling.       Some knee pain--no meds  Skin: Negative for rash.       Did start yearly derm checks  Allergic/Immunologic: Positive for environmental allergies. Negative for immunocompromised state.       Zyrtec helps  Neurological: Negative for dizziness, syncope, light-headedness and headaches.  Hematological: Negative for adenopathy. Does not bruise/bleed easily.  Psychiatric/Behavioral: Negative for dysphoric mood and sleep disturbance. The patient is not nervous/anxious.        CPAP every night--has 4 machines!       Objective:   Physical Exam Constitutional:      Appearance: Normal appearance.  HENT:     Right Ear: Tympanic membrane, ear canal and external ear normal.     Left Ear: Tympanic membrane, ear canal and external ear normal.     Ears:     Comments: Mild cerumen    Mouth/Throat:     Pharynx: No oropharyngeal exudate or posterior oropharyngeal erythema.  Eyes:     Conjunctiva/sclera: Conjunctivae normal.     Pupils: Pupils are equal, round, and reactive to light.  Neck:     Comments: ~2cm deep mobile mass below the angle of right mandible Cardiovascular:     Rate and Rhythm: Normal rate and regular rhythm.     Pulses: Normal pulses.     Heart sounds: No murmur heard. No gallop.   Pulmonary:     Effort: Pulmonary  effort is normal.     Breath sounds: Normal breath sounds. No wheezing or rales.  Abdominal:     Palpations: Abdomen is soft.     Tenderness: There is no abdominal tenderness.  Musculoskeletal:     Cervical back: Neck supple.     Right lower leg: No edema.     Left lower leg: No edema.  Skin:    General: Skin is warm.     Findings:  No rash.  Neurological:     General: No focal deficit present.     Mental Status: He is alert and oriented to person, place, and time.  Psychiatric:        Mood and Affect: Mood normal.        Behavior: Behavior normal.            Assessment & Plan:

## 2020-09-01 NOTE — Addendum Note (Signed)
Addended by: Pilar Grammes on: 09/01/2020 09:27 AM   Modules accepted: Orders

## 2020-09-01 NOTE — Assessment & Plan Note (Signed)
Uses CPAP every night 

## 2020-09-01 NOTE — Assessment & Plan Note (Signed)
Mild urge incontinence---not ready for meds

## 2020-09-01 NOTE — Patient Instructions (Signed)
If you don't wind up staying on the semaglutide---I would just see you in a year instead of 3 months (and you will need to come in just for the second shingrix)

## 2020-09-01 NOTE — Assessment & Plan Note (Signed)
Quiet on famotidine

## 2020-09-01 NOTE — Addendum Note (Signed)
Addended by: Viviana Simpler I on: 09/01/2020 09:38 AM   Modules accepted: Orders

## 2020-09-01 NOTE — Assessment & Plan Note (Addendum)
Healthy but discussed fitness, etc Colon due in December Discussed PSA--we will recheck COVID booster--probably in the fall at this point (with flu vaccine)  shingrix

## 2020-09-02 LAB — PSA: PSA: 0.25 ng/mL (ref 0.10–4.00)

## 2020-09-28 ENCOUNTER — Other Ambulatory Visit: Payer: Self-pay | Admitting: Otolaryngology

## 2020-09-28 DIAGNOSIS — R221 Localized swelling, mass and lump, neck: Secondary | ICD-10-CM

## 2020-10-15 ENCOUNTER — Other Ambulatory Visit: Payer: Self-pay

## 2020-10-15 ENCOUNTER — Ambulatory Visit
Admission: RE | Admit: 2020-10-15 | Discharge: 2020-10-15 | Disposition: A | Discharge status: Home / Self Care | Payer: PRIVATE HEALTH INSURANCE | Source: Ambulatory Visit | Attending: Otolaryngology | Admitting: Otolaryngology

## 2020-10-15 DIAGNOSIS — R221 Localized swelling, mass and lump, neck: Secondary | ICD-10-CM | POA: Diagnosis not present

## 2020-10-15 LAB — POCT I-STAT CREATININE: Creatinine, Ser: 0.8 mg/dL (ref 0.61–1.24)

## 2020-10-15 MED ORDER — IOHEXOL 350 MG/ML SOLN
75.0000 mL | Freq: Once | INTRAVENOUS | Status: AC | PRN
  Administered 2020-10-15: 75 mL via INTRAVENOUS

## 2020-12-03 ENCOUNTER — Ambulatory Visit: Payer: PRIVATE HEALTH INSURANCE | Admitting: Internal Medicine

## 2020-12-03 ENCOUNTER — Encounter: Payer: Self-pay | Admitting: Internal Medicine

## 2020-12-03 ENCOUNTER — Other Ambulatory Visit: Payer: Self-pay

## 2020-12-03 VITALS — BP 130/86 | HR 84 | Temp 97.3°F | Ht 69.0 in | Wt 379.0 lb

## 2020-12-03 DIAGNOSIS — Z23 Encounter for immunization: Secondary | ICD-10-CM | POA: Diagnosis not present

## 2020-12-03 MED ORDER — SEMAGLUTIDE 14 MG PO TABS: 1.0000 | ORAL_TABLET | Freq: Every day | ORAL | 3 refills | Status: DC

## 2020-12-03 NOTE — Assessment & Plan Note (Signed)
Has tolerated the semaglutide and lost 7# No sig side effects Will increase to 14mg  PE when due next year

## 2020-12-03 NOTE — Progress Notes (Signed)
Subjective:    Patient ID: Jason Willis, male    DOB: 1961-04-23, 60 y.o.   MRN: [id number]  HPI Here for follow up of obesity treatment This visit occurred during the SARS-CoV-2 public health emergency.  Safety protocols were in place, including screening questions prior to the visit, additional usage of staff PPE, and extensive cleaning of exam room while observing appropriate contact time as indicated for disinfecting solutions.   No problems with the semaglutide Appetite is down Some constipation but no pain or sig side effects Uses miralax prn  Has lost 7# with this  Current Outpatient Medications on File Prior to Visit  Medication Sig Dispense Refill  . amLODipine (NORVASC) 5 MG tablet TAKE 1 TABLET BY MOUTH EVERY DAY 90 tablet 3  . aspirin 81 MG tablet Take 81 mg by mouth daily.    . cetirizine (ZYRTEC) 10 MG chewable tablet Chew 10 mg by mouth. Take 1 by mouth once daily as needed    . famotidine (PEPCID) 10 MG tablet Take 10 mg by mouth daily.    Marland Kitchen glucosamine-chondroitin 500-400 MG tablet Take 1 tablet by mouth daily.    Marland Kitchen losartan-hydrochlorothiazide (HYZAAR) 100-25 MG tablet TAKE 1 TABLET BY MOUTH EVERY DAY 90 tablet 3  . multivitamin (THERAGRAN) per tablet Take 1 tablet by mouth daily.    . Omega-3 Fatty Acids (FISH OIL) 1000 MG CAPS Take by mouth. Take 1 capsule by mouth two times a day    . Semaglutide 7 MG TABS Take 1 tablet by mouth daily. 30 tablet 11  . triamcinolone cream (KENALOG) 0.1 % Apply 1 application topically 2 (two) times daily as needed. 45 g 1   No current facility-administered medications on file prior to visit.    No Known Allergies  Past Medical History:  Diagnosis Date  . Allergy   . Diverticulitis   . Dyslipidemia    HDL low   . GERD (gastroesophageal reflux disease)   . Hyperlipidemia   . Hypertension   . Seborrheic dermatitis   . Sleep apnea    cpap  . Sleep disorder    obstructive sleep apnea    Past Surgical History:   Procedure Laterality Date  . COLONOSCOPY N/A 06/20/2016   Procedure: COLONOSCOPY;  Surgeon: Irene Shipper, MD;  Location: [facility];  Service: Endoscopy;  Laterality: N/A;  . NO PAST SURGERIES      Family History  Problem Relation Age of Onset  . COPD Father   . Hypertension Father   . Coronary artery disease Father   . Colon polyps Mother   . Anemia Mother   . Stroke Mother   . Obesity Sister        gastric bypass   . Multiple sclerosis Sister   . Cancer Paternal Uncle        2 uncles-- liver and another with new diagnosis (?lung)  . Diabetes Neg Hx   . Colon cancer Neg Hx     Social History   Socioeconomic History  . Marital status: Married    Spouse name: Not on file  . Number of children: 0  . Years of education: Not on file  . Highest education level: Not on file  Occupational History  . 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 Smoking status: Never Smoker  . Smokeless tobacco: Never Used  Substance and Sexual Activity  . Alcohol use: Yes  Alcohol/week: 0.0 standard drinks    Comment: rare  . Drug use: No  . Sexual activity: Not on file  Other Topics Concern  . Not on file  Social History Narrative   Widowed then remarried 6/05   Social Determinants of Health   Financial Resource Strain: Not on file  Food Insecurity: Not on file  Transportation Needs: Not on file  Physical Activity: Not on file  Stress: Not on file  Social Connections: Not on file  Intimate Partner Violence: Not on file   Review of Systems Had neck CT---negative No longer feels the lump    Objective:   Physical Exam Constitutional:      Appearance: He is obese.  Neurological:     Mental Status: He is alert.            Assessment & Plan:

## 2021-01-05 ENCOUNTER — Telehealth: Payer: Self-pay | Admitting: Internal Medicine

## 2021-01-05 NOTE — Telephone Encounter (Signed)
[place] Night - Client TELEPHONE ADVICE RECORD AccessNurse Patient Name: Jason Willis [facility] Gender: Male DOB: 23-Jan-1961 Age: 60 Y 74 M 8 D Return Phone Number: [phone] (Primary), [phone] (Secondary) Address: City/ State/ Zip: [place] [address] Client [facility] Night - Client Client Site [place] Physician Viviana Simpler- MD Contact Type Call Who Is Calling Patient / Member / Family / Caregiver Call Type Triage / Clinical Relationship To Patient Self Return Phone Number [phone] (Primary) Chief Complaint Abdominal Pain Reason for Call Symptomatic / Request for Health Information Initial Comment Caller states he has Sx of diarrhea, cramping. Translation No Nurse Assessment Nurse: D'Heur Lucia Gaskins, RN, Adrienne Date/Time (Eastern Time): 01/05/2021 8:36:24 AM Confirm and document reason for call. If symptomatic, describe symptoms. ---Caller states he has diarrhea, cramping for past four days. No vomiting or fever. BM's are mucousy and loose. Home covid test was negative. Does the patient have any new or worsening symptoms? ---Yes Will a triage be completed? ---Yes Related visit to physician within the last 2 weeks? ---No Does the PT have any chronic conditions? (i.e. diabetes, asthma, this includes High risk factors for pregnancy, etc.) ---Yes List chronic conditions. ---HTN, Is this a behavioral health or substance abuse call? ---No Guidelines Guideline Title Affirmed Question Affirmed Notes Nurse Date/Time (Eastern Time) Diarrhea [1] MODERATE diarrhea (e.g., 4-6 times / day more than normal) AND [2] present > 48 hours (2 days) D'Heur Lucia Gaskins, RN, Adrienne 01/05/2021 8:39:11 AM Disp. Time Eilene Ghazi Time) Disposition Final User 01/05/2021 8:43:38 AM See PCP within 24 Hours Yes D'Heur Lucia Gaskins, RN, Vincente [place] PLEASE NOTE: All timestamps contained within this report are  represented as [place] Standard Time. CONFIDENTIALTY NOTICE: This fax transmission is intended only for the addressee. It contains information that is legally privileged, confidential or otherwise protected from use or disclosure. If you are not the intended recipient, you are strictly prohibited from reviewing, disclosing, copying using or disseminating any of this information or taking any action in reliance on or regarding this information. If you have received this fax in error, please notify us immediately by telephone so that we can arrange for its return to [place]. Phone: [phone], Toll-Free: [phone], Fax: [phone] Page: 2 of 2 Call Id: [id number] [place] Disagree/Comply Comply Caller Understands Yes PreDisposition Call Doctor Care Advice Given Per Guideline SEE PCP WITHIN 24 HOURS: * Drink more fluids, at least 8 to 10 cups daily. One cup equals 8 oz (240 ml). * Avoid carbonated soft drinks (soda) as these can make your diarrhea worse. * Avoid caffeinated beverages. Reason: Caffeine is mildly dehydrating. FOOD AND NUTRITION DURING MILD TO MODERATE DIARRHEA: * Begin with boiled starches / cereals (e.g., potatoes, rice, noodles, wheat, oats) with a small amount of salt to taste. * AVOID milk and dairy products if these make your diarrhea worse. * AVOID greasy, fatty or spicy foods. DIARRHEA MEDICINE - LOPERAMIDE (IMODIUM AD): * This medicine helps decrease diarrhea. It is available over-the-counter (OTC) in a drugstore. * Maximum dosage: 16 mg per day (8 capsules). * Do not use for more than 2 days. * Signs of dehydration occur (e.g., no urine over 12 hours, very dry mouth, lightheaded, etc.) * Bloody stools * Constant or severe abdomen pain * You become worse CALL BACK IF: CARE ADVICE given per Diarrhea (Adult) guideline. Comments User: Vincente Liberty, D'Heur Lucia Gaskins, RN Date/Time Eilene Ghazi Time): 01/05/2021 8:41:12 AM Caller has hx of diverticulitis. He had 4-5 bouts of diarrhea yesterday,  twice this morning. Referrals REFERRED TO PCP OFFICE

## 2021-01-05 NOTE — Telephone Encounter (Signed)
Mr. Raborn called in he was sent to triage for Diarrhea and was told to be seen within 24hrs. Wanted to know if he can get seen today at 1230 if possible. And also the triage nurse told to see about bringing a stool sample.

## 2021-01-05 NOTE — Telephone Encounter (Signed)
See note below access nurse note that [place] sent to [facility] CMA and Dr Silvio Pate to see if pt could be worked in today.

## 2021-01-05 NOTE — Telephone Encounter (Signed)
Spoke to pt. Made him an appt for tomorrow (7-7). He will cancel if he is doing better. He said to let Dr Silvio Pate know he was down 20 lbs with Rybelsus.

## 2021-01-06 ENCOUNTER — Other Ambulatory Visit: Payer: Self-pay

## 2021-01-06 ENCOUNTER — Encounter: Payer: Self-pay | Admitting: Internal Medicine

## 2021-01-06 ENCOUNTER — Ambulatory Visit: Payer: No Typology Code available for payment source | Admitting: Internal Medicine

## 2021-01-06 DIAGNOSIS — R197 Diarrhea, unspecified: Secondary | ICD-10-CM | POA: Diagnosis not present

## 2021-01-06 NOTE — Assessment & Plan Note (Signed)
Rapid and significant onset favor infectious cause--but persisting for 6 days (though some better) Benign exam--no blood Discussed options of stopping semaglutide or cutting in half short term  If persists, will need to try off it for a while

## 2021-01-06 NOTE — Progress Notes (Signed)
Subjective:    Patient ID: Jason Willis, male    DOB: 1961/04/28, 60 y.o.   MRN: [id number]  HPI Here due to diarrhea This visit occurred during the SARS-CoV-2 public health emergency.  Safety protocols were in place, including screening questions prior to the visit, additional usage of staff PPE, and extensive cleaning of exam room while observing appropriate contact time as indicated for disinfecting solutions.   Started 5 days ago---got bad pain and had to pull off road Had explosive diarrhea On and off that day----pain then diarrhea (probably 10 times) This has persisted since then Got imodium but didn't take it--as he was some better No stools yesterday till evening--then went 6 times  No blood Has continued on the semaglutide (14mg  for a month without any problems)  Current Outpatient Medications on File Prior to Visit  Medication Sig Dispense Refill   amLODipine (NORVASC) 5 MG tablet TAKE 1 TABLET BY MOUTH EVERY DAY 90 tablet 3   aspirin 81 MG tablet Take 81 mg by mouth daily.     cetirizine (ZYRTEC) 10 MG chewable tablet Chew 10 mg by mouth. Take 1 by mouth once daily as needed     famotidine (PEPCID) 10 MG tablet Take 10 mg by mouth daily.     glucosamine-chondroitin 500-400 MG tablet Take 1 tablet by mouth daily.     losartan-hydrochlorothiazide (HYZAAR) 100-25 MG tablet TAKE 1 TABLET BY MOUTH EVERY DAY 90 tablet 3   multivitamin (THERAGRAN) per tablet Take 1 tablet by mouth daily.     Omega-3 Fatty Acids (FISH OIL) 1000 MG CAPS Take by mouth. Take 1 capsule by mouth two times a day     Semaglutide 14 MG TABS Take 1 tablet by mouth daily. 90 tablet 3   triamcinolone cream (KENALOG) 0.1 % Apply 1 application topically 2 (two) times daily as needed. 45 g 1   No current facility-administered medications on file prior to visit.    No Known Allergies  Past Medical History:  Diagnosis Date   Allergy    Diverticulitis    Dyslipidemia    HDL low    GERD  (gastroesophageal reflux disease)    Hyperlipidemia    Hypertension    Seborrheic dermatitis    Sleep apnea    cpap   Sleep disorder    obstructive sleep apnea    Past Surgical History:  Procedure Laterality Date   COLONOSCOPY N/A 06/20/2016   Procedure: COLONOSCOPY;  Surgeon: Irene Shipper, MD;  Location: [facility];  Service: Endoscopy;  Laterality: N/A;   NO PAST SURGERIES      Family History  Problem Relation Age of Onset   COPD Father    Hypertension Father    Coronary artery disease Father    Colon polyps Mother    Anemia Mother    Stroke Mother    Obesity Sister        gastric bypass    Multiple sclerosis Sister    Cancer Paternal Uncle        2 uncles-- liver and another with new diagnosis (?lung)   Diabetes Neg Hx    Colon cancer Neg Hx     Social History   Socioeconomic History   Marital status: Married    Spouse name: Not on file   Number of children: 0   Years of education: Not on file   Highest education level: Not on file  Occupational History   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[employer]  Comment: Now Engineer, maintenance  Tobacco Use   Smoking status: Never   Smokeless tobacco: Never  Substance and Sexual Activity   Alcohol use: Yes    Alcohol/week: 0.0 standard drinks    Comment: rare   Drug use: No   Sexual activity: Not on file  Other Topics Concern   Not on file  Social History Narrative   Widowed then remarried 6/05   Social Determinants of Health   Financial Resource Strain: Not on file  Food Insecurity: Not on file  Transportation Needs: Not on file  Physical Activity: Not on file  Stress: Not on file  Social Connections: Not on file  Intimate Partner Violence: Not on file   Review of Systems Appetite was gone-ate little (but more in the past 2 days) Weight is down further No other ill contacts Vomited once---when he tried vinegar and honey (wife suggested)    Objective:   Physical  Exam Constitutional:      Appearance: Normal appearance.  Cardiovascular:     Rate and Rhythm: Normal rate and regular rhythm.     Heart sounds: No murmur heard.   No gallop.  Pulmonary:     Effort: Pulmonary effort is normal.     Breath sounds: Normal breath sounds. No wheezing or rales.  Abdominal:     Palpations: Abdomen is soft.     Tenderness: There is no abdominal tenderness.     Comments: Decreased breath sounds  Musculoskeletal:     Cervical back: Neck supple.  Lymphadenopathy:     Cervical: No cervical adenopathy.  Neurological:     Mental Status: He is alert.           Assessment & Plan:

## 2021-01-11 ENCOUNTER — Other Ambulatory Visit: Payer: Self-pay | Admitting: Internal Medicine

## 2021-04-12 ENCOUNTER — Other Ambulatory Visit: Payer: Self-pay | Admitting: Internal Medicine

## 2021-05-16 ENCOUNTER — Encounter: Payer: Self-pay | Admitting: Internal Medicine

## 2021-05-30 ENCOUNTER — Telehealth: Payer: Self-pay | Admitting: Internal Medicine

## 2021-05-30 NOTE — Telephone Encounter (Signed)
Called an gave pt e-mail address to send the records will check in the morning and send to Dr. Silvio Pate

## 2021-05-30 NOTE — Telephone Encounter (Signed)
Jason Willis called in and wanted to know if he can email his cpap recordings due to he has been trying to update it on mychart but hasnt been able to do so. And he has an appointment on Friday via mychart

## 2021-06-01 NOTE — Telephone Encounter (Signed)
Received e-mail from patient and sent e-mail to [facility]

## 2021-06-03 ENCOUNTER — Telehealth (INDEPENDENT_AMBULATORY_CARE_PROVIDER_SITE_OTHER): Payer: No Typology Code available for payment source | Admitting: Internal Medicine

## 2021-06-03 ENCOUNTER — Encounter: Payer: Self-pay | Admitting: Internal Medicine

## 2021-06-03 ENCOUNTER — Other Ambulatory Visit: Payer: Self-pay

## 2021-06-03 DIAGNOSIS — G4733 Obstructive sleep apnea (adult) (pediatric): Secondary | ICD-10-CM | POA: Diagnosis not present

## 2021-06-03 NOTE — Assessment & Plan Note (Signed)
Doing extremely well on the CPAP 100% compliance with AHI of 2 or less

## 2021-06-03 NOTE — Progress Notes (Signed)
Subjective:    Patient ID: Jason Willis, male    DOB: 1961/01/19, 60 y.o.   MRN: [id number]  HPI Video virtual visit for review of his CPAP therapy Identification done Reviewed limitations and billing and he gave consent Participants---patient in his home and I am in my office  Recently got new CPAP machine This has the app---he sent me the report  His compliance is excellent-- AHI is 2 or less for entire nights AHI was 120 before CPAP!!  Current Outpatient Medications on File Prior to Visit  Medication Sig Dispense Refill   amLODipine (NORVASC) 5 MG tablet TAKE 1 TABLET BY MOUTH EVERY DAY 90 tablet 3   aspirin 81 MG tablet Take 81 mg by mouth daily.     cetirizine (ZYRTEC) 10 MG chewable tablet Chew 10 mg by mouth. Take 1 by mouth once daily as needed     famotidine (PEPCID) 10 MG tablet Take 10 mg by mouth daily.     glucosamine-chondroitin 500-400 MG tablet Take 1 tablet by mouth daily.     losartan-hydrochlorothiazide (HYZAAR) 100-25 MG tablet TAKE 1 TABLET BY MOUTH EVERY DAY 90 tablet 3   multivitamin (THERAGRAN) per tablet Take 1 tablet by mouth daily.     Omega-3 Fatty Acids (FISH OIL) 1000 MG CAPS Take by mouth. Take 1 capsule by mouth two times a day     triamcinolone cream (KENALOG) 0.1 % Apply 1 application topically 2 (two) times daily as needed. 45 g 1   No current facility-administered medications on file prior to visit.    No Known Allergies  Past Medical History:  Diagnosis Date   Allergy    Diverticulitis    Dyslipidemia    HDL low    GERD (gastroesophageal reflux disease)    Hyperlipidemia    Hypertension    Seborrheic dermatitis    Sleep apnea    cpap   Sleep disorder    obstructive sleep apnea    Past Surgical History:  Procedure Laterality Date   COLONOSCOPY N/A 06/20/2016   Procedure: COLONOSCOPY;  Surgeon: Irene Shipper, MD;  Location: [facility];  Service: Endoscopy;  Laterality: N/A;   NO PAST SURGERIES      Family History  Problem  Relation Age of Onset   COPD Father    Hypertension Father    Coronary artery disease Father    Colon polyps Mother    Anemia Mother    Stroke Mother    Obesity Sister        gastric bypass    Multiple sclerosis Sister    Cancer Paternal Uncle        2 uncles-- liver and another with new diagnosis (?lung)   Diabetes Neg Hx    Colon cancer Neg Hx     Social History   Socioeconomic History   Marital status: Married    Spouse name: Not on file   Number of children: 0   Years of education: Not on file   Highest education level: Not on file  Occupational History   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, maintenance  Tobacco Use   Smoking status: Never   Smokeless tobacco: Never  Substance and Sexual Activity   Alcohol use: Yes    Alcohol/week: 0.0 standard drinks    Comment: rare   Drug use: No   Sexual activity: Not on file  Other Topics Concern   Not on file  Social History  Narrative   Widowed then remarried 6/05   Social Determinants of Health   Financial Resource Strain: Not on file  Food Insecurity: Not on file  Transportation Needs: Not on file  Physical Activity: Not on file  Stress: Not on file  Social Connections: Not on file  Intimate Partner Violence: Not on file   Review of Systems Appetite is fine Stopped rybelsus due to diarrhea at 14mg     Objective:   Physical Exam Constitutional:      Appearance: He is obese.  Pulmonary:     Effort: No respiratory distress.  Neurological:     Mental Status: He is alert.  Psychiatric:        Mood and Affect: Mood normal.        Behavior: Behavior normal.           Assessment & Plan:

## 2021-06-03 NOTE — Assessment & Plan Note (Signed)
Did okay with rybelsus 7mg --but diarrhea with 14 mg (and it won't be covered). He will see how he does now---on CPAP If no progress with weight gain, will consider injectable Rx with liraglutide or semaglutide

## 2021-09-09 ENCOUNTER — Encounter: Payer: PRIVATE HEALTH INSURANCE | Admitting: Internal Medicine

## 2021-09-16 ENCOUNTER — Encounter: Payer: No Typology Code available for payment source | Admitting: Internal Medicine

## 2021-12-30 IMAGING — CT CT NECK W/ CM
4 of 5 series · 14 of 35 positions shown, 16 images · IV contrast (omnipaque)
Comparison: None.

CLINICAL DATA: Palpable lump and right neck noticed during COVID
infection July 2020

EXAM:
CT NECK WITH CONTRAST
TECHNIQUE: Multidetector CT imaging of the neck was performed using the
standard protocol following the bolus administration of intravenous
contrast.
CONTRAST:  75mL OMNIPAQUE IOHEXOL 350 MG/ML SOLN

[Series 2: axial neck neck (person_name) 2.00 · axial · 0.56mm/px · z∈[-681,-551]mm · 3 of 131 slices shown]
[im 33/131  bone]
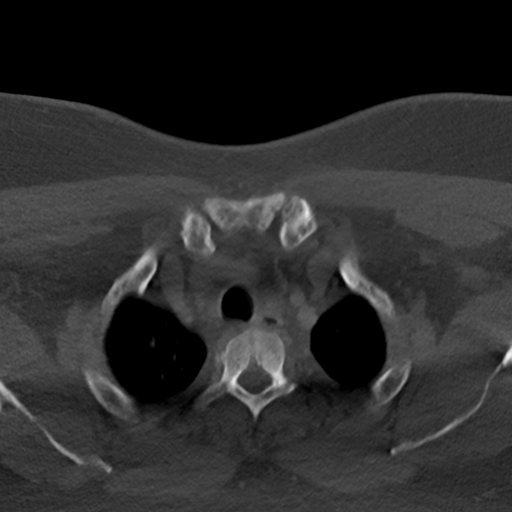
[im 66/131  bone]
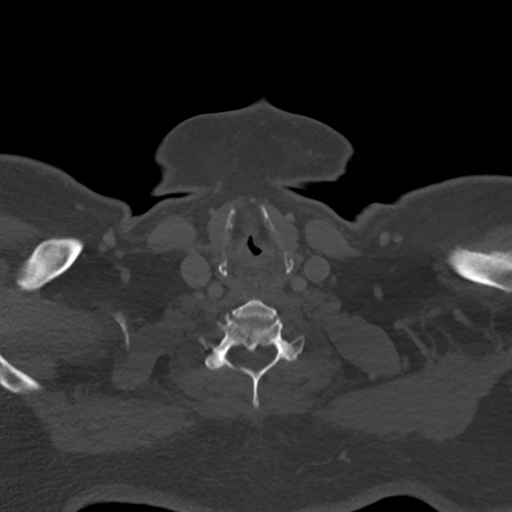
[im 98/131  bone]
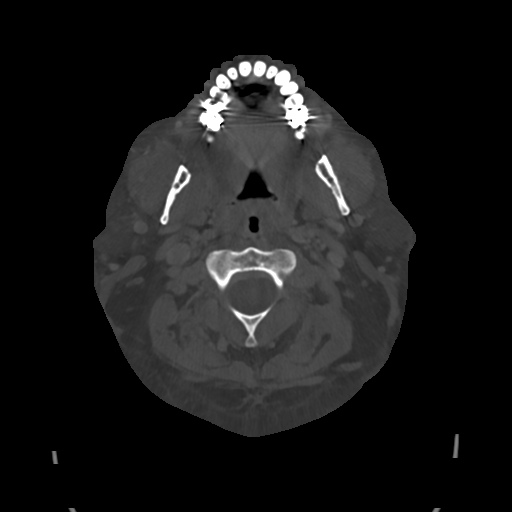

[Series 4: coronal neck neck (person_name) 2.00 cor · coronal · 0.52mm/px · 3 of 115 slices shown]
[im 25/115  bone]
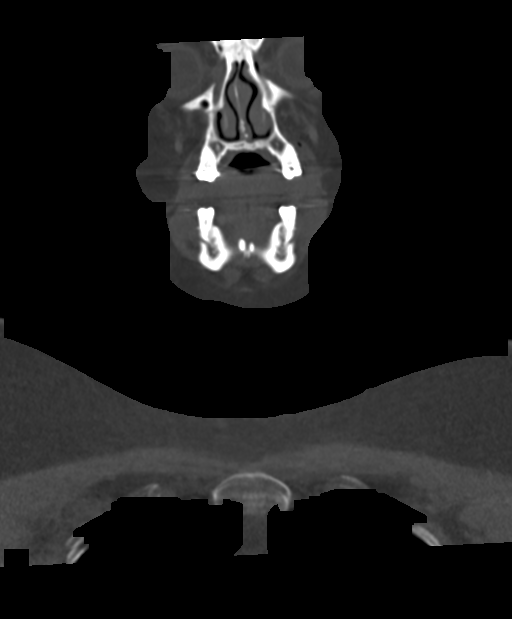
[im 47/115  bone]
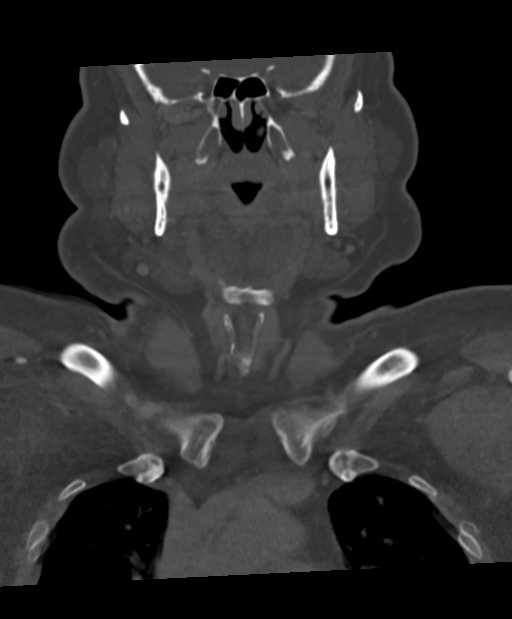
[im 69/115  bone]
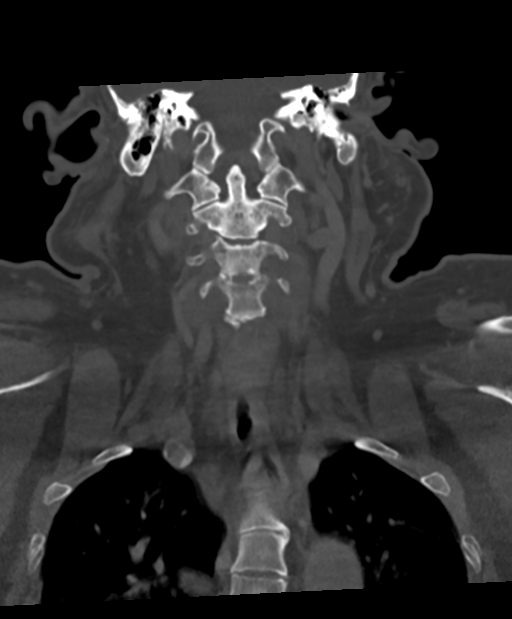

[Series 6: sagittal neck neck (person_name) 2.00 sag · sagittal · 0.45mm/px · 5 of 134 slices shown, 6 images]
[im 45/134  bone]
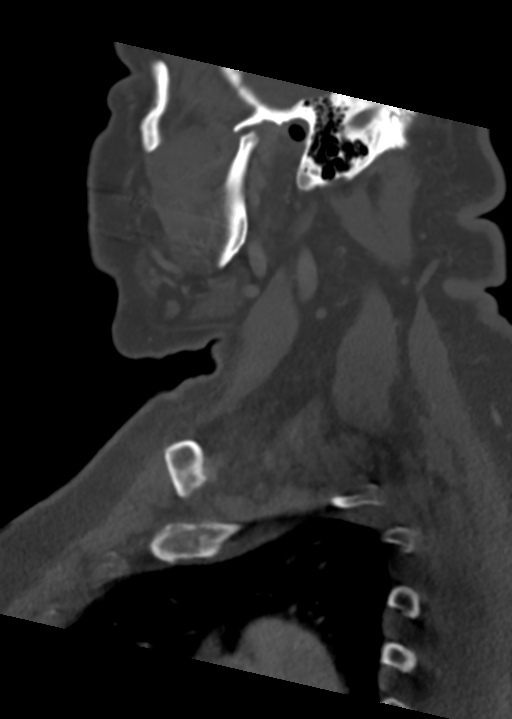
[im 56/134  bone]
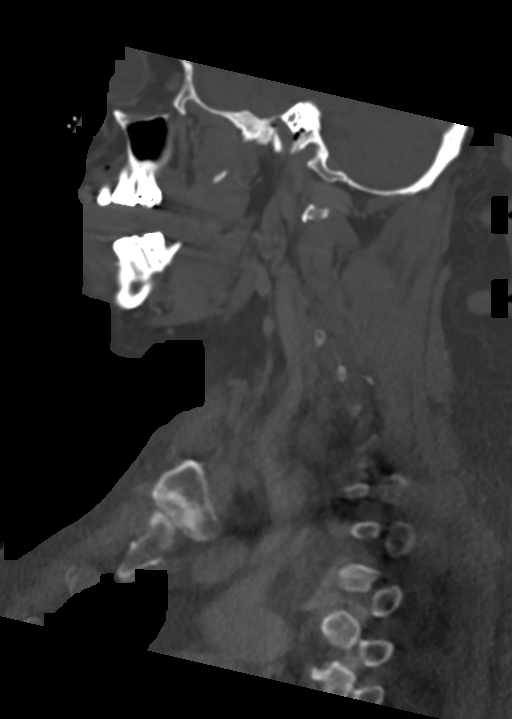
[im 67/134  soft-tissue]
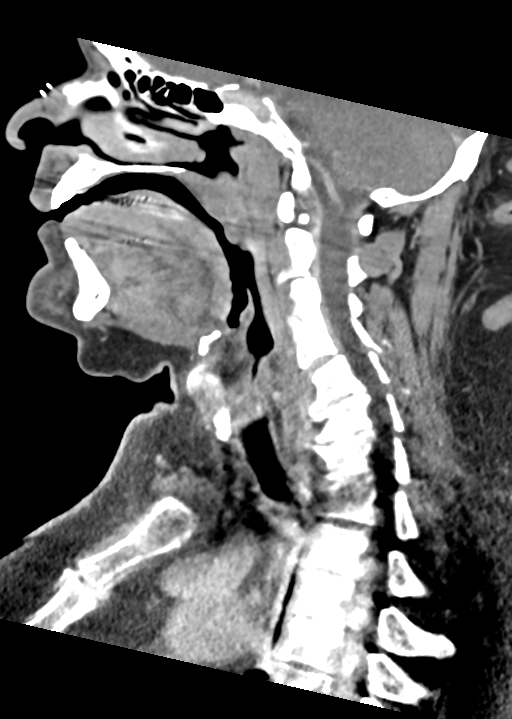
[im 67/134  bone]
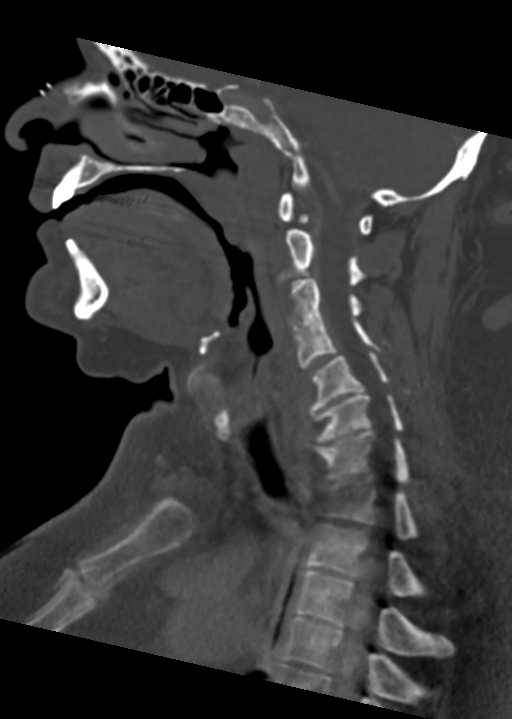
[im 78/134  bone]
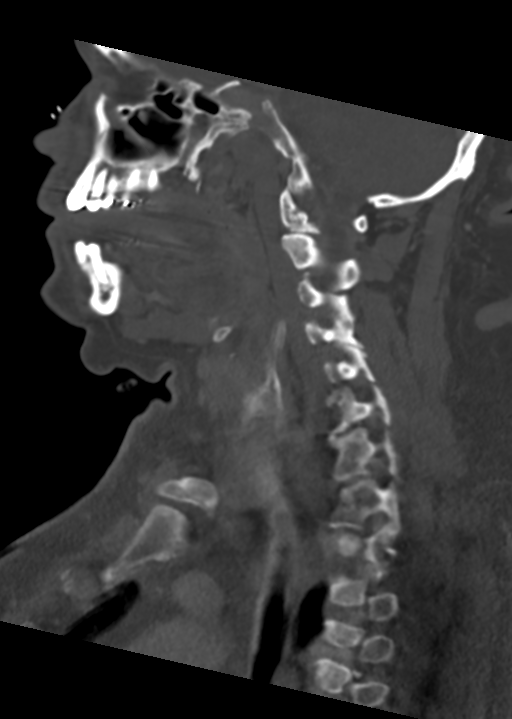
[im 89/134  bone]
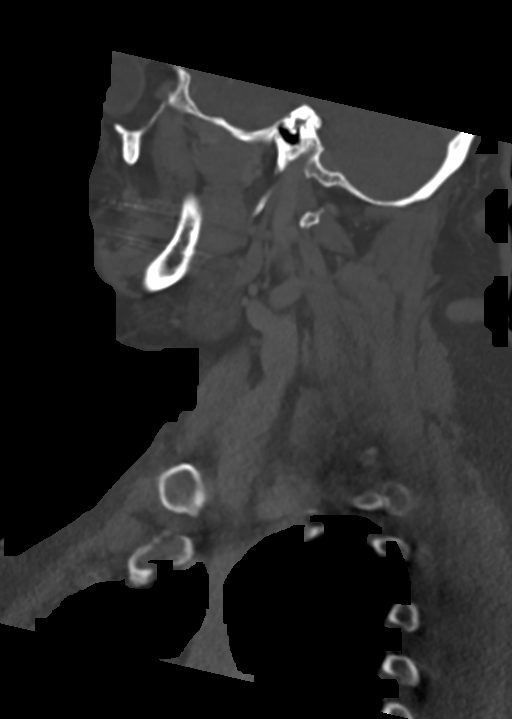

[Series 8: ax oropharynx neck neck (person_name) 2.00 ax · axial · 0.45mm/px · z∈[-704,-549]mm · 3 of 161 slices shown, 4 images]
[im 41/161  soft-tissue]
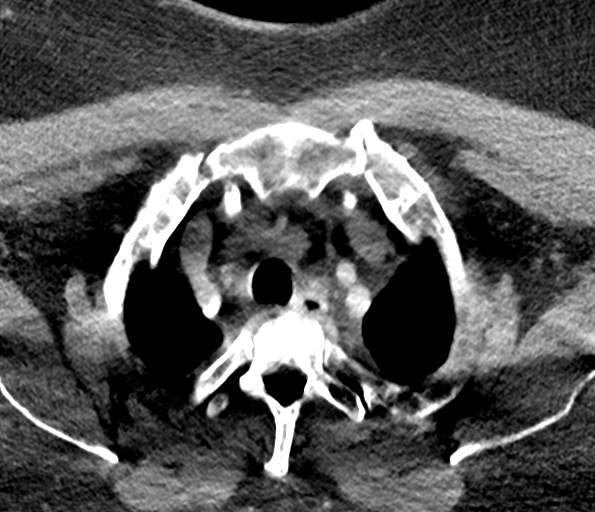
[im 41/161  bone]
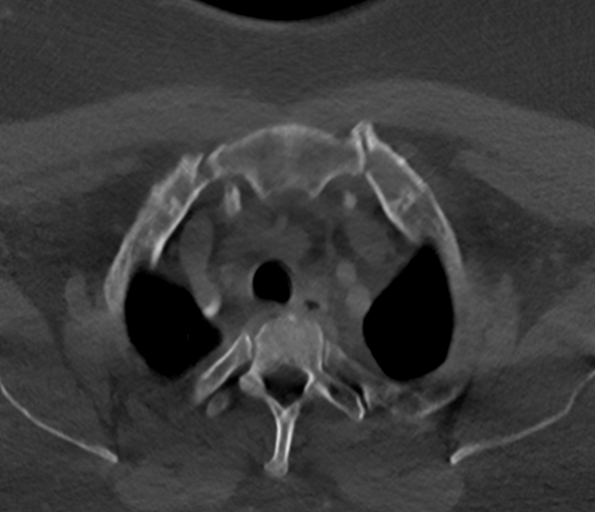
[im 81/161  bone]
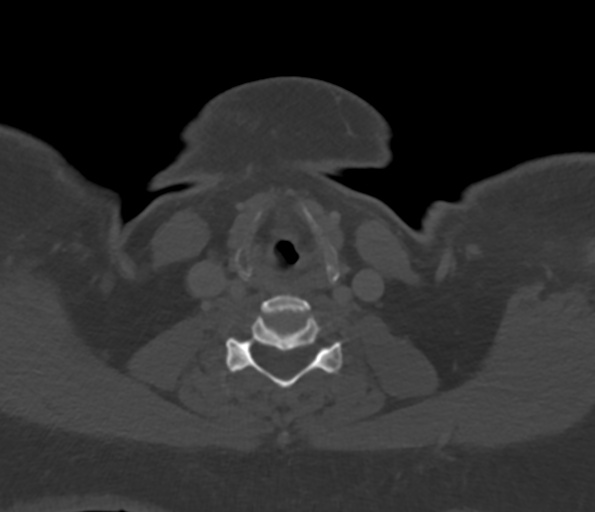
[im 121/161  bone]
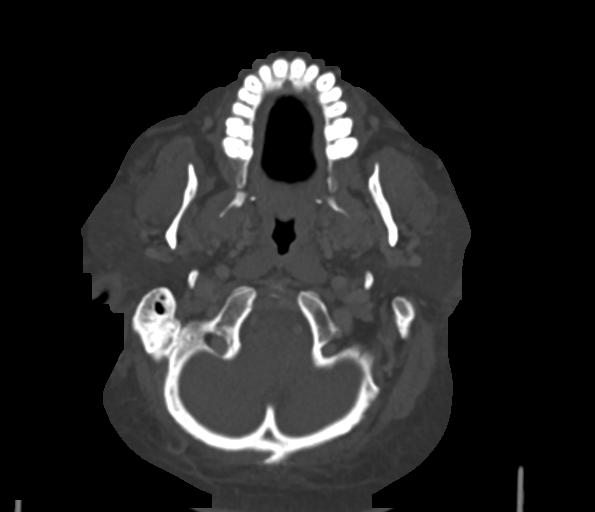

[14 of 35 positions shown; findings below may reference images not displayed]

FINDINGS: Pharynx and larynx: Asymmetric appearance of the glottis but no
clear signs of paresis. No focal inflammation or mass is seen.

Salivary glands: No inflammation, mass, or stone. The palpable
markers near the right parotid tail which is symmetrically sized.

Thyroid: Normal.

Lymph nodes: None enlarged or abnormal density.

Vascular: Negative.

Limited intracranial: Negative.

Visualized orbits: Negative.

Mastoids and visualized paranasal sinuses: Clear.

Skeleton: Degenerative reversal of cervical lordosis. No acute or
aggressive finding. TMJ osteoarthritis to a notable degree in the
left

Upper chest: Negative
IMPRESSION: No mass or adenopathy to explain the palpable complaint.

## 2022-01-04 ENCOUNTER — Other Ambulatory Visit: Payer: Self-pay | Admitting: Internal Medicine

## 2022-01-20 ENCOUNTER — Ambulatory Visit (INDEPENDENT_AMBULATORY_CARE_PROVIDER_SITE_OTHER): Payer: No Typology Code available for payment source | Admitting: Internal Medicine

## 2022-01-20 ENCOUNTER — Encounter: Payer: Self-pay | Admitting: Internal Medicine

## 2022-01-20 VITALS — BP 128/88 | HR 79 | Temp 97.2°F | Ht 69.0 in | Wt 383.0 lb

## 2022-01-20 DIAGNOSIS — K219 Gastro-esophageal reflux disease without esophagitis: Secondary | ICD-10-CM

## 2022-01-20 DIAGNOSIS — G4733 Obstructive sleep apnea (adult) (pediatric): Secondary | ICD-10-CM | POA: Diagnosis not present

## 2022-01-20 DIAGNOSIS — Z0000 Encounter for general adult medical examination without abnormal findings: Secondary | ICD-10-CM

## 2022-01-20 DIAGNOSIS — I10 Essential (primary) hypertension: Secondary | ICD-10-CM

## 2022-01-20 DIAGNOSIS — Z125 Encounter for screening for malignant neoplasm of prostate: Secondary | ICD-10-CM

## 2022-01-20 LAB — COMPREHENSIVE METABOLIC PANEL
ALT: 33 U/L (ref 0–53)
AST: 22 U/L (ref 0–37)
Albumin: 4.2 g/dL (ref 3.5–5.2)
Alkaline Phosphatase: 64 U/L (ref 39–117)
BUN: 14 mg/dL (ref 6–23)
CO2: 30 mEq/L (ref 19–32)
Calcium: 9.1 mg/dL (ref 8.4–10.5)
Chloride: 102 mEq/L (ref 96–112)
Creatinine, Ser: 0.77 mg/dL (ref 0.40–1.50)
GFR: 96.03 mL/min (ref >60.00)
Glucose, Bld: 125 mg/dL — ABNORMAL HIGH (ref 70–99)
Potassium: 3.9 mEq/L (ref 3.5–5.1)
Sodium: 141 mEq/L (ref 135–145)
Total Bilirubin: 0.7 mg/dL (ref 0.2–1.2)
Total Protein: 6.8 g/dL (ref 6.0–8.3)

## 2022-01-20 LAB — CBC
HCT: 44.5 % (ref 39.0–52.0)
Hemoglobin: 14.7 g/dL (ref 13.0–17.0)
MCHC: 32.9 g/dL (ref 30.0–36.0)
MCV: 94.2 fl (ref 78.0–100.0)
Platelets: 252 10*3/uL (ref 150.0–400.0)
RBC: 4.72 Mil/uL (ref 4.22–5.81)
RDW: 13.7 % (ref 11.5–15.5)
WBC: 7.1 10*3/uL (ref 4.0–10.5)

## 2022-01-20 LAB — LIPID PANEL
Cholesterol: 168 mg/dL (ref 0–200)
HDL: 42.5 mg/dL (ref >39.00)
LDL Cholesterol: 102 mg/dL — ABNORMAL HIGH (ref 0–99)
NonHDL: 125.73
Total CHOL/HDL Ratio: 4
Triglycerides: 117 mg/dL (ref 0.0–149.0)
VLDL: 23.4 mg/dL (ref 0.0–40.0)

## 2022-01-20 LAB — PSA: PSA: 0.19 ng/mL (ref 0.10–4.00)

## 2022-01-20 MED ORDER — OZEMPIC (0.25 OR 0.5 MG/DOSE) 2 MG/3ML ~~LOC~~ SOPN: 0.2500 mg | PEN_INJECTOR | SUBCUTANEOUS | 1 refills | Status: DC

## 2022-01-20 MED ORDER — FLUCONAZOLE 150 MG PO TABS: 150.0000 mg | ORAL_TABLET | ORAL | 1 refills | Status: DC

## 2022-01-20 NOTE — Assessment & Plan Note (Signed)
BP Readings from Last 3 Encounters:  01/20/22 128/88  01/06/21 124/86  12/03/20 130/86   amlodpine 5, losartan/HCTZ 100/25 daily

## 2022-01-20 NOTE — Progress Notes (Signed)
Subjective:    Patient ID: Jason Willis, male    DOB: 06-29-1961, 61 y.o.   MRN: [id number]  HPI Here for physical  Doing well with CPAP Shows me report---over 6 hours pretty much every night and mostly over 8 hours AHI under 2 while wearing it  Had GI problems with rybelsus Then insurance wouldn't cover it Wants to try injection COVID in December--and hasn't gotten back to exercise. He is ready to start  Groin rash persistent More angry this week Using [employer]  Current Outpatient Medications on File Prior to Visit  Medication Sig Dispense Refill   amLODipine (NORVASC) 5 MG tablet TAKE 1 TABLET BY MOUTH EVERY DAY 90 tablet 3   aspirin 81 MG tablet Take 81 mg by mouth daily.     cetirizine (ZYRTEC) 10 MG chewable tablet Chew 10 mg by mouth. Take 1 by mouth once daily as needed     famotidine (PEPCID) 10 MG tablet Take 10 mg by mouth daily.     glucosamine-chondroitin 500-400 MG tablet Take 1 tablet by mouth daily.     losartan-hydrochlorothiazide (HYZAAR) 100-25 MG tablet TAKE 1 TABLET BY MOUTH EVERY DAY 90 tablet 3   multivitamin (THERAGRAN) per tablet Take 1 tablet by mouth daily.     Omega-3 Fatty Acids (FISH OIL) 1000 MG CAPS Take by mouth. Take 1 capsule by mouth two times a day     No current facility-administered medications on file prior to visit.    No Known Allergies  Past Medical History:  Diagnosis Date   Allergy    Diverticulitis    Dyslipidemia    HDL low    GERD (gastroesophageal reflux disease)    Hyperlipidemia    Hypertension    Seborrheic dermatitis    Sleep apnea    cpap   Sleep disorder    obstructive sleep apnea    Past Surgical History:  Procedure Laterality Date   COLONOSCOPY N/A 06/20/2016   Procedure: COLONOSCOPY;  Surgeon: Irene Shipper, MD;  Location: [facility];  Service: Endoscopy;  Laterality: N/A;   NO PAST SURGERIES      Family History  Problem Relation Age of Onset   COPD Father    Hypertension Father    Coronary  artery disease Father    Colon polyps Mother    Anemia Mother    Stroke Mother    Obesity Sister        gastric bypass    Multiple sclerosis Sister    Cancer Paternal Uncle        2 uncles-- liver and another with new diagnosis (?lung)   Diabetes Neg Hx    Colon cancer Neg Hx     Social History   Socioeconomic History   Marital status: Married    Spouse name: Not on file   Number of children: 0   Years of education: Not on file   Highest education level: Not on file  Occupational History   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, maintenance  Tobacco Use   Smoking status: Never    Passive exposure: Past   Smokeless tobacco: Never  Substance and Sexual Activity   Alcohol use: Yes    Alcohol/week: 0.0 standard drinks of alcohol    Comment: rare   Drug use: No   Sexual activity: Not on file  Other Topics Concern   Not on file  Social History Narrative   Widowed then remarried  6/05   Social Determinants of Health   Financial Resource Strain: Not on file  Food Insecurity: Not on file  Transportation Needs: Not on file  Physical Activity: Not on file  Stress: Not on file  Social Connections: Not on file  Intimate Partner Violence: Not on file   Review of Systems  Constitutional:  Negative for fatigue and unexpected weight change.       Wears seat belt  HENT:  Negative for dental problem, hearing loss and tinnitus.        Keeps up with dentist  Eyes:  Negative for visual disturbance.       No diplopia or unilateral vision loss  Respiratory:  Negative for cough, chest tightness and shortness of breath.   Cardiovascular:  Negative for chest pain, palpitations and leg swelling.  Gastrointestinal:  Negative for blood in stool and constipation.       No heartburn on prilosec  Endocrine: Negative for polydipsia and polyuria.  Genitourinary:  Negative for difficulty urinating.       No sexual problems  Musculoskeletal:   Negative for back pain and joint swelling.       Ongoing left knee pain if side pressure--from long ago  Skin:        No suspicious lesion  Allergic/Immunologic: Positive for environmental allergies. Negative for immunocompromised state.       Zyrtec is effective  Neurological:  Negative for dizziness, syncope, light-headedness and headaches.  Hematological:  Negative for adenopathy. Does not bruise/bleed easily.  Psychiatric/Behavioral:  Negative for dysphoric mood and sleep disturbance. The patient is not nervous/anxious.        Objective:   Physical Exam Constitutional:      Appearance: Normal appearance.  HENT:     Mouth/Throat:     Pharynx: No oropharyngeal exudate or posterior oropharyngeal erythema.  Eyes:     Conjunctiva/sclera: Conjunctivae normal.     Pupils: Pupils are equal, round, and reactive to light.  Cardiovascular:     Rate and Rhythm: Normal rate and regular rhythm.     Pulses: Normal pulses.     Heart sounds: No murmur heard.    No gallop.  Pulmonary:     Effort: Pulmonary effort is normal.     Breath sounds: Normal breath sounds. No wheezing or rales.  Abdominal:     Palpations: Abdomen is soft.     Tenderness: There is no abdominal tenderness.  Musculoskeletal:     Cervical back: Neck supple.     Right lower leg: No edema.     Left lower leg: No edema.  Lymphadenopathy:     Cervical: No cervical adenopathy.  Skin:    Findings: No lesion.     Comments: Inguinal rash--some inflammation  Neurological:     General: No focal deficit present.     Mental Status: He is alert and oriented to person, place, and time.  Psychiatric:        Mood and Affect: Mood normal.        Behavior: Behavior normal.            Assessment & Plan:

## 2022-01-20 NOTE — Assessment & Plan Note (Signed)
I have personally reviewed the Medicare Annual Wellness questionnaire and have noted 1. The patient's medical and social history 2. Their use of alcohol, tobacco or illicit drugs 3. Their current medications and supplements 4. The patient's functional ability including ADL's, fall risks, home safety risks and hearing or visual             impairment. 5. Diet and physical activities 6. Evidence for depression or mood disorders  The patients weight, height, BMI and visual acuity have been recorded in the chart I have made referrals, counseling and provided education to the patient based review of the above and I have provided the pt with a written personalized care plan for preventive services.  I have provided you with a copy of your personalized plan for preventive services. Please take the time to review along with your updated medication list.  Was due for colon--contacted Dr Henrene Pastor about this Will check PSA Getting back to exercise Updated COVID and flu vaccines in the fall

## 2022-01-20 NOTE — Assessment & Plan Note (Signed)
Quiet on the omeprazole

## 2022-01-20 NOTE — Assessment & Plan Note (Signed)
Does well with the CPAP

## 2022-01-20 NOTE — Assessment & Plan Note (Signed)
Did well with oral semaglutide but not approved Will try injection---0.'25mg'$  weekly and titrate

## 2022-01-23 ENCOUNTER — Telehealth: Payer: Self-pay

## 2022-01-23 NOTE — Telephone Encounter (Signed)
Prior auth started for Ozempic (0.25 or 0.5 MG/DOSE) '2MG'$ /3ML pen-injectors. Sherley Bounds (Key: [id number]) Rx #: [phone] Waiting for determination.

## 2022-01-24 NOTE — Telephone Encounter (Signed)
Prior auth for Ozempic (0.25 or 0.5 MG/DOSE) '2MG'$ /3ML pen-injectors has been denied. Jason Willis (Key: [id number]) Rx #: [id number] Denial letter sent to scanning. DECISION: DENIED You do not meet the clinical requirements for this medication.  The denial was based on our criteria for Ozempic Inj '2mg'$ /12m.  Per your health plan's criteria, this drug is covered if you meet the following: (1) You have a disease of high blood sugar (type 2 diabetes) as confirmed by one of the following: (A) For ongoing treatment for your disease of high blood sugar levels (type 2 diabetes), your doctor must provide medical records (for example: chart notes) showing that you have the disease. (B) Your doctor provides medical records (for example: chart notes) showing that you have a disease of high blood sugar levels (type 2 diabetes) as seen by one of the following laboratory values: (I) Your blood sugar level (hemoglobin A1c) is greater than or equal to 6.5%. (II) Your blood sugar level before eating/drinking (fasting plasma glucose) is greater than or equal to '126mg'$ /dL. (III) Your blood sugar level after drinking (2-hour plasma glucose during oral glucose tolerance test) is greater than or equal to '200mg'$ /dL. The information provided does not show that you meet the criteria listed above

## 2022-01-26 NOTE — Telephone Encounter (Signed)
Spoke to pt. He said he will do better with lifestyle changes.

## 2022-01-30 ENCOUNTER — Encounter: Payer: Self-pay | Admitting: Internal Medicine

## 2022-01-30 ENCOUNTER — Telehealth: Payer: Self-pay

## 2022-01-30 NOTE — Telephone Encounter (Signed)
Patient was scheduled for PV on 9/11 at 8:30 and colonoscopy was scheduled for 10/2 at 8:00.

## 2022-01-30 NOTE — Telephone Encounter (Signed)
-----   Message from Irene Shipper, MD sent at 01/24/2022  2:42 PM EDT ----- Denice Paradise, We sent him a recall letter November 2022. We will reach out directly for scheduling. Thanks for checking. Best, John  Nurse covering for Vaughan Basta, Please contact this patient and set up pre-visit appointment for follow up colonoscopy for a history of polyps. Thanks! Dr. Henrene Pastor   ----- Message ----- From: Venia Carbon, MD Sent: 01/20/2022   9:51 AM EDT To: Irene Shipper, MD  Jenny Reichmann, He was due for colon recall 12/22 but doesn't remember a letter. Can your staff reach out to schedule him? Rich

## 2022-01-30 NOTE — Telephone Encounter (Signed)
Thanks so much. 

## 2022-01-30 NOTE — Telephone Encounter (Signed)
Please contact this patient and set up pre-visit appointment for follow up colonoscopy for a history of polyps.  Thanks!  Dr. Henrene Pastor

## 2022-03-02 ENCOUNTER — Telehealth: Payer: Self-pay

## 2022-03-02 NOTE — Telephone Encounter (Signed)
Dr. Henrene Pastor,  [name] is scheduled for recall colon in [place] 10/2, with PV 9/11.  His BMI is 56.53.  Last colon was at [facility] 06/20/16. Would you like OV, Direct to [facility], or proceed in [place] as scheduled?  Thanks, Aaron Edelman (PV)

## 2022-03-03 NOTE — Telephone Encounter (Signed)
He does not qualify for the [place] due to BMI >50. Please cancel that appointment He can be direct case at hospital, as previous. Pre-visit can schedule  Thanks

## 2022-03-07 NOTE — Telephone Encounter (Signed)
[facility] appt cancelled, pt aware. Pt offered appt 11/16 but states he cannot do that date. Pt wants to be called when next available hosp date available.

## 2022-03-09 ENCOUNTER — Encounter: Payer: No Typology Code available for payment source | Admitting: Internal Medicine

## 2022-03-31 ENCOUNTER — Other Ambulatory Visit: Payer: Self-pay

## 2022-03-31 DIAGNOSIS — Z1211 Encounter for screening for malignant neoplasm of colon: Secondary | ICD-10-CM

## 2022-03-31 NOTE — Telephone Encounter (Signed)
Pt scheduled for previsit 06/02/22 at 4:30pm, colon scheduled at [facility] 06/15/22 at 12:30pm. Spoke with pt and he is aware of appts.

## 2022-03-31 NOTE — Telephone Encounter (Signed)
Called to schedule pt for [facility] colon 06/15/22 at noon. [facility] does not have this as a block for [employer]. Will call back Monday to try and schedule again to see if block has been released.

## 2022-04-03 ENCOUNTER — Encounter: Payer: No Typology Code available for payment source | Admitting: Internal Medicine

## 2022-04-06 ENCOUNTER — Other Ambulatory Visit: Payer: Self-pay | Admitting: Internal Medicine

## 2022-05-29 ENCOUNTER — Telehealth: Payer: Self-pay | Admitting: *Deleted

## 2022-05-29 NOTE — Telephone Encounter (Signed)
Yesi,  This pt's BMI is greater than 50; their procedure will need to be performed at the hospital.  Thanks,  Chrishon Martino 

## 2022-05-29 NOTE — Telephone Encounter (Signed)
Procedure scheduled at the hospital 06-15-22 with Dr Henrene Pastor.

## 2022-06-05 ENCOUNTER — Other Ambulatory Visit: Payer: Self-pay

## 2022-06-05 ENCOUNTER — Ambulatory Visit (AMBULATORY_SURGERY_CENTER): Payer: No Typology Code available for payment source

## 2022-06-05 VITALS — Ht 69.0 in | Wt 395.0 lb

## 2022-06-05 DIAGNOSIS — Z86010 Personal history of colonic polyps: Secondary | ICD-10-CM

## 2022-06-05 MED ORDER — NA SULFATE-K SULFATE-MG SULF 17.5-3.13-1.6 GM/177ML PO SOLN: 1.0000 | Freq: Once | ORAL | 0 refills | Status: AC

## 2022-06-05 NOTE — Progress Notes (Signed)
Denies allergies to eggs or soy products. Denies complication of anesthesia or sedation. Denies use of weight loss medication. Denies use of O2.   Emmi instructions given for colonoscopy.  

## 2022-06-07 ENCOUNTER — Encounter (HOSPITAL_COMMUNITY): Payer: Self-pay | Admitting: Internal Medicine

## 2022-06-12 ENCOUNTER — Encounter: Payer: Self-pay | Admitting: Internal Medicine

## 2022-06-15 ENCOUNTER — Ambulatory Visit (HOSPITAL_COMMUNITY): Payer: No Typology Code available for payment source | Admitting: Anesthesiology

## 2022-06-15 ENCOUNTER — Encounter (HOSPITAL_COMMUNITY): Payer: Self-pay | Admitting: Internal Medicine

## 2022-06-15 ENCOUNTER — Other Ambulatory Visit: Payer: Self-pay

## 2022-06-15 ENCOUNTER — Encounter (HOSPITAL_COMMUNITY)
Admission: RE | Disposition: A | Discharge status: Home / Self Care | Payer: Self-pay | Source: Home / Self Care | Attending: Internal Medicine

## 2022-06-15 ENCOUNTER — Ambulatory Visit (HOSPITAL_COMMUNITY)
Admission: RE | Admit: 2022-06-15 | Discharge: 2022-06-15 | Disposition: A | Discharge status: Home / Self Care | Payer: No Typology Code available for payment source | Attending: Internal Medicine | Admitting: Internal Medicine

## 2022-06-15 ENCOUNTER — Ambulatory Visit (HOSPITAL_BASED_OUTPATIENT_CLINIC_OR_DEPARTMENT_OTHER): Payer: No Typology Code available for payment source | Admitting: Anesthesiology

## 2022-06-15 DIAGNOSIS — K635 Polyp of colon: Secondary | ICD-10-CM

## 2022-06-15 DIAGNOSIS — Z7722 Contact with and (suspected) exposure to environmental tobacco smoke (acute) (chronic): Secondary | ICD-10-CM | POA: Insufficient documentation

## 2022-06-15 DIAGNOSIS — K5730 Diverticulosis of large intestine without perforation or abscess without bleeding: Secondary | ICD-10-CM | POA: Insufficient documentation

## 2022-06-15 DIAGNOSIS — I10 Essential (primary) hypertension: Secondary | ICD-10-CM

## 2022-06-15 DIAGNOSIS — D123 Benign neoplasm of transverse colon: Secondary | ICD-10-CM | POA: Diagnosis not present

## 2022-06-15 DIAGNOSIS — K648 Other hemorrhoids: Secondary | ICD-10-CM | POA: Diagnosis not present

## 2022-06-15 DIAGNOSIS — Z86010 Personal history of colonic polyps: Secondary | ICD-10-CM

## 2022-06-15 DIAGNOSIS — D122 Benign neoplasm of ascending colon: Secondary | ICD-10-CM | POA: Insufficient documentation

## 2022-06-15 DIAGNOSIS — Z6841 Body Mass Index (BMI) 40.0 and over, adult: Secondary | ICD-10-CM | POA: Diagnosis not present

## 2022-06-15 DIAGNOSIS — Z09 Encounter for follow-up examination after completed treatment for conditions other than malignant neoplasm: Secondary | ICD-10-CM | POA: Diagnosis not present

## 2022-06-15 DIAGNOSIS — G4733 Obstructive sleep apnea (adult) (pediatric): Secondary | ICD-10-CM | POA: Diagnosis not present

## 2022-06-15 DIAGNOSIS — Z1211 Encounter for screening for malignant neoplasm of colon: Secondary | ICD-10-CM

## 2022-06-15 HISTORY — PX: POLYPECTOMY: SHX5525

## 2022-06-15 HISTORY — PX: COLONOSCOPY WITH PROPOFOL: SHX5780

## 2022-06-15 SURGERY — COLONOSCOPY WITH PROPOFOL
Anesthesia: Monitor Anesthesia Care

## 2022-06-15 MED ORDER — PROPOFOL 500 MG/50ML IV EMUL
INTRAVENOUS | Status: DC | PRN
  Administered 2022-06-15: 125 ug/kg/min via INTRAVENOUS

## 2022-06-15 MED ORDER — LIDOCAINE 2% (20 MG/ML) 5 ML SYRINGE
INTRAMUSCULAR | Status: DC | PRN
  Administered 2022-06-15: 100 mg via INTRAVENOUS

## 2022-06-15 MED ORDER — LACTATED RINGERS IV SOLN
INTRAVENOUS | Status: DC
  Administered 2022-06-15: 1000 mL via INTRAVENOUS

## 2022-06-15 MED ORDER — SODIUM CHLORIDE 0.9 % IV SOLN: INTRAVENOUS | Status: DC

## 2022-06-15 MED ORDER — PROPOFOL 10 MG/ML IV BOLUS
INTRAVENOUS | Status: DC | PRN
  Administered 2022-06-15: 100 mg via INTRAVENOUS

## 2022-06-15 SURGICAL SUPPLY — 22 items

## 2022-06-15 NOTE — Transfer of Care (Signed)
Immediate Anesthesia Transfer of Care Note  Patient: Jason Willis  Procedure(s) Performed: COLONOSCOPY WITH PROPOFOL POLYPECTOMY  Patient Location: Endoscopy Unit  Anesthesia Type:General  Level of Consciousness: awake  Airway & Oxygen Therapy: Patient Spontanous Breathing and Patient connected to face mask oxygen  Post-op Assessment: Report given to RN and Post -op Vital signs reviewed and stable  Post vital signs: Reviewed and stable  Last Vitals:  Vitals Value Taken Time  BP 154/92 06/15/22 1316  Temp    Pulse 86 06/15/22 1318  Resp 18 06/15/22 1318  SpO2 98 % 06/15/22 1318  Vitals shown include unvalidated device data.  Last Pain:  Vitals:   06/15/22 1137  TempSrc: Tympanic  PainSc: 0-No pain         Complications: No notable events documented.

## 2022-06-15 NOTE — Op Note (Signed)
[facility] Patient Name: Jason Willis Procedure Date: 06/15/2022 MRN: [id number] Attending MD: Docia Chuck. Henrene Pastor , MD, [id number] Date of Birth: [birth year] CSN: [id number] Age: 61 Admit Type: Outpatient Procedure:                Colonoscopy with cold snare polypectomy x 3 Indications:              High risk colon cancer surveillance: Personal                            history of multiple (3 or more) adenomas Providers:                Docia Chuck. Henrene Pastor, MD, Doristine Johns, RN, Cletis Athens, Technician Referring MD:             Recall colonoscopy Medicines:                Monitored Anesthesia Care Complications:            No immediate complications. Estimated blood loss:                            None. Estimated Blood Loss:     Estimated blood loss: none. Procedure:                Pre-Anesthesia Assessment:                           - Prior to the procedure, a History and Physical                            was performed, and patient medications and                            allergies were reviewed. The patient's tolerance of                            previous anesthesia was also reviewed. The risks                            and benefits of the procedure and the sedation                            options and risks were discussed with the patient.                            All questions were answered, and informed consent                            was obtained. Prior Anticoagulants: The patient has                            taken no anticoagulant or antiplatelet agents.  After reviewing the risks and benefits, the patient                            was deemed in satisfactory condition to undergo the                            procedure.                           After obtaining informed consent, the colonoscope                            was passed under direct vision. Throughout the                             procedure, the patient's blood pressure, pulse, and                            oxygen saturations were monitored continuously. The                            CF-HQ190L ([id number]) Olympus colonoscope was                            introduced through the anus and advanced to the the                            cecum, identified by appendiceal orifice and                            ileocecal valve. The ileocecal valve, appendiceal                            orifice, and rectum were photographed. The quality                            of the bowel preparation was good. The colonoscopy                            was performed without difficulty. The patient                            tolerated the procedure well. The bowel preparation                            used was SUPREP via split dose instruction. Scope In: 12:50:07 PM Scope Out: 1:11:27 PM Scope Withdrawal Time: 0 hours 18 minutes 11 seconds  Total Procedure Duration: 0 hours 21 minutes 20 seconds  Findings:      Three polyps were found in the transverse colon and ascending colon. The       polyps were 2 to 3 mm in size. These polyps were removed with a cold       snare. Resection and retrieval were complete.      Multiple diverticula were found in the entire  colon.      Internal hemorrhoids were found during retroflexion. The hemorrhoids       were small.      The exam was otherwise without abnormality on direct and retroflexion       views. Impression:               - Three 2 to 3 mm polyps in the transverse colon                            and in the ascending colon, removed with a cold                            snare. Resected and retrieved.                           - Diverticulosis in the entire examined colon.                           - Internal hemorrhoids.                           - The examination was otherwise normal on direct                            and retroflexion views. Moderate Sedation:       none Recommendation:           - Repeat colonoscopy in 5 years for surveillance.                           - Patient has a contact number available for                            emergencies. The signs and symptoms of potential                            delayed complications were discussed with the                            patient. Return to normal activities tomorrow.                            Written discharge instructions were provided to the                            patient.                           - Resume previous diet.                           - Continue present medications.                           - Await pathology results. Procedure Code(s):        --- Professional ---  45385, Colonoscopy, flexible; with removal of                            tumor(s), polyp(s), or other lesion(s) by snare                            technique Diagnosis Code(s):        --- Professional ---                           Z86.010, Personal history of colonic polyps                           D12.3, Benign neoplasm of transverse colon (hepatic                            flexure or splenic flexure)                           D12.2, Benign neoplasm of ascending colon                           K64.8, Other hemorrhoids                           K57.30, Diverticulosis of large intestine without                            perforation or abscess without bleeding CPT copyright 2022 American Medical Association. All rights reserved. The codes documented in this report are preliminary and upon coder review may  be revised to meet current compliance requirements. Docia Chuck. Henrene Pastor, MD 06/15/2022 1:23:58 PM This report has been signed electronically. Number of Addenda: 0

## 2022-06-15 NOTE — Discharge Instructions (Signed)

## 2022-06-15 NOTE — Anesthesia Preprocedure Evaluation (Signed)
Anesthesia Evaluation  Patient identified by MRN, date of birth, ID band Patient awake    Reviewed: Allergy & Precautions, NPO status , Patient's Chart, lab work & pertinent test results  History of Anesthesia Complications Negative for: history of anesthetic complications  Airway Mallampati: II  TM Distance: >3 FB Neck ROM: Full    Dental  (+) Teeth Intact, Dental Advisory Given   Pulmonary neg shortness of breath, sleep apnea and Continuous Positive Airway Pressure Ventilation , neg COPD, neg recent URI   breath sounds clear to auscultation       Cardiovascular hypertension, Pt. on medications (-) angina (-) Cardiac Stents, (-) CABG and (-) CHF  Rhythm:Regular     Neuro/Psych negative neurological ROS  negative psych ROS   GI/Hepatic Neg liver ROS,GERD  Medicated and Controlled,,  Endo/Other    Morbid obesity  Renal/GU negative Renal ROS     Musculoskeletal negative musculoskeletal ROS (+)    Abdominal   Peds  Hematology negative hematology ROS (+)   Anesthesia Other Findings   Reproductive/Obstetrics                             Anesthesia Physical Anesthesia Plan  ASA: 3  Anesthesia Plan: MAC   Post-op Pain Management:    Induction: Intravenous  PONV Risk Score and Plan: 1 and Propofol infusion and Treatment may vary due to age or medical condition  Airway Management Planned: Nasal Cannula, Natural Airway and Simple Face Mask  Additional Equipment: None  Intra-op Plan:   Post-operative Plan:   Informed Consent: I have reviewed the patients History and Physical, chart, labs and discussed the procedure including the risks, benefits and alternatives for the proposed anesthesia with the patient or authorized representative who has indicated his/her understanding and acceptance.     Dental advisory given  Plan Discussed with: CRNA  Anesthesia Plan Comments:         Anesthesia Quick Evaluation

## 2022-06-15 NOTE — H&P (Signed)
HISTORY OF PRESENT ILLNESS:  Jason Willis is a 61 y.o. male with morbid obesity who presents today for surveillance colonoscopy.  Previous examinations 2007 (negative for neoplasia) and 2017 (3 adenomas).  He presents today for surveillance colonoscopy.  No complaints.  Tolerated prep  REVIEW OF SYSTEMS:  All non-GI ROS negative.  Past Medical History:  Diagnosis Date   Allergy    Asthma    Diverticulitis    Dyslipidemia    HDL low    GERD (gastroesophageal reflux disease)    Hyperlipidemia    Hypertension    Seborrheic dermatitis    Sleep apnea    cpap   Sleep disorder    obstructive sleep apnea    Past Surgical History:  Procedure Laterality Date   COLONOSCOPY N/A 06/20/2016   Procedure: COLONOSCOPY;  Surgeon: Irene Shipper, MD;  Location: [facility];  Service: Endoscopy;  Laterality: N/A;   NO PAST SURGERIES     WISDOM TOOTH EXTRACTION Bilateral     Social History Jason Willis  reports that he has never smoked. He has been exposed to tobacco smoke. He has never used smokeless tobacco. He reports current alcohol use. He reports that he does not use drugs.  family history includes Anemia in his mother; COPD in his father; Cancer in his paternal uncle; Colon polyps in his mother; Coronary artery disease in his father; Hypertension in his father; Multiple sclerosis in his sister; Obesity in his sister; Stroke in his mother.  No Known Allergies     PHYSICAL EXAMINATION: Vital signs: BP 138/89   Pulse 84   Temp 97.9 F (36.6 C) (Tympanic)   Resp 13   Ht '5\' 9"'$  (1.753 m)   Wt (!) 179.2 kg   SpO2 98%   BMI 58.34 kg/m  General: Well-developed, well-nourished, no acute distress HEENT: Sclerae are anicteric, conjunctiva pink. Oral mucosa intact Lungs: Clear Heart: Regular Abdomen: soft, obese, nontender, nondistended, no obvious ascites, no peritoneal signs, normal bowel sounds. No organomegaly. Extremities: No edema Psychiatric: alert and oriented x3.  Cooperative     ASSESSMENT:  1.  Personal history of multiple adenomatous colon polyps. 2.  Morbid obesity   PLAN:  Surveillance colonoscopy.

## 2022-06-16 NOTE — Anesthesia Postprocedure Evaluation (Signed)
Anesthesia Post Note  Patient: Jason Willis  Procedure(s) Performed: COLONOSCOPY WITH PROPOFOL POLYPECTOMY     Patient location during evaluation: Endoscopy Anesthesia Type: MAC Level of consciousness: awake and alert Pain management: pain level controlled Vital Signs Assessment: post-procedure vital signs reviewed and stable Respiratory status: spontaneous breathing, nonlabored ventilation and respiratory function stable Cardiovascular status: stable and blood pressure returned to baseline Postop Assessment: no apparent nausea or vomiting Anesthetic complications: no  No notable events documented.  Last Vitals:  Vitals:   06/15/22 1326 06/15/22 1336  BP: 138/84 (!) 135/100  Pulse: 96 81  Resp: (!) 21 13  Temp:    SpO2: 93% 99%    Last Pain:  Vitals:   06/15/22 1336  TempSrc:   PainSc: 0-No pain                 Areli Frary

## 2022-06-18 ENCOUNTER — Encounter (HOSPITAL_COMMUNITY): Payer: Self-pay | Admitting: Internal Medicine

## 2022-06-19 LAB — SURGICAL PATHOLOGY

## 2022-12-25 ENCOUNTER — Other Ambulatory Visit: Payer: Self-pay | Admitting: Internal Medicine

## 2023-01-26 ENCOUNTER — Encounter: Payer: No Typology Code available for payment source | Admitting: Internal Medicine

## 2023-02-02 ENCOUNTER — Ambulatory Visit (INDEPENDENT_AMBULATORY_CARE_PROVIDER_SITE_OTHER): Payer: Managed Care, Other (non HMO) | Admitting: Internal Medicine

## 2023-02-02 ENCOUNTER — Encounter: Payer: Self-pay | Admitting: Internal Medicine

## 2023-02-02 ENCOUNTER — Other Ambulatory Visit: Payer: Self-pay | Admitting: Internal Medicine

## 2023-02-02 VITALS — BP 130/80 | HR 86 | Temp 98.1°F | Ht 68.75 in | Wt 381.0 lb

## 2023-02-02 DIAGNOSIS — G4733 Obstructive sleep apnea (adult) (pediatric): Secondary | ICD-10-CM | POA: Diagnosis not present

## 2023-02-02 DIAGNOSIS — R7309 Other abnormal glucose: Secondary | ICD-10-CM | POA: Diagnosis not present

## 2023-02-02 DIAGNOSIS — I10 Essential (primary) hypertension: Secondary | ICD-10-CM

## 2023-02-02 DIAGNOSIS — Z125 Encounter for screening for malignant neoplasm of prostate: Secondary | ICD-10-CM

## 2023-02-02 DIAGNOSIS — Z0000 Encounter for general adult medical examination without abnormal findings: Secondary | ICD-10-CM

## 2023-02-02 DIAGNOSIS — E119 Type 2 diabetes mellitus without complications: Secondary | ICD-10-CM

## 2023-02-02 LAB — CBC
HCT: 44.7 % (ref 39.0–52.0)
Hemoglobin: 14.8 g/dL (ref 13.0–17.0)
MCHC: 33.2 g/dL (ref 30.0–36.0)
MCV: 93.9 fl (ref 78.0–100.0)
Platelets: 255 10*3/uL (ref 150.0–400.0)
RBC: 4.76 Mil/uL (ref 4.22–5.81)
RDW: 13.6 % (ref 11.5–15.5)
WBC: 7.3 10*3/uL (ref 4.0–10.5)

## 2023-02-02 LAB — LIPID PANEL
Cholesterol: 170 mg/dL (ref 0–200)
HDL: 39.6 mg/dL (ref >39.00)
LDL Cholesterol: 102 mg/dL — ABNORMAL HIGH (ref 0–99)
NonHDL: 130.12
Total CHOL/HDL Ratio: 4
Triglycerides: 142 mg/dL (ref 0.0–149.0)
VLDL: 28.4 mg/dL (ref 0.0–40.0)

## 2023-02-02 LAB — COMPREHENSIVE METABOLIC PANEL
ALT: 37 U/L (ref 0–53)
AST: 25 U/L (ref 0–37)
Albumin: 4.2 g/dL (ref 3.5–5.2)
Alkaline Phosphatase: 69 U/L (ref 39–117)
BUN: 17 mg/dL (ref 6–23)
CO2: 29 mEq/L (ref 19–32)
Calcium: 8.9 mg/dL (ref 8.4–10.5)
Chloride: 99 mEq/L (ref 96–112)
Creatinine, Ser: 0.86 mg/dL (ref 0.40–1.50)
GFR: 92.2 mL/min (ref >60.00)
Glucose, Bld: 214 mg/dL — ABNORMAL HIGH (ref 70–99)
Potassium: 3.9 mEq/L (ref 3.5–5.1)
Sodium: 136 mEq/L (ref 135–145)
Total Bilirubin: 0.8 mg/dL (ref 0.2–1.2)
Total Protein: 6.9 g/dL (ref 6.0–8.3)

## 2023-02-02 LAB — PSA: PSA: 0.23 ng/mL (ref 0.10–4.00)

## 2023-02-02 LAB — HEMOGLOBIN A1C: Hgb A1c MFr Bld: 9.3 % — ABNORMAL HIGH (ref 4.6–6.5)

## 2023-02-02 MED ORDER — FLUCONAZOLE 150 MG PO TABS: 150.0000 mg | ORAL_TABLET | ORAL | 2 refills | Status: AC

## 2023-02-02 MED ORDER — LOSARTAN POTASSIUM-HCTZ 100-25 MG PO TABS: 1.0000 | ORAL_TABLET | Freq: Every day | ORAL | 3 refills | Status: DC

## 2023-02-02 MED ORDER — TIRZEPATIDE 2.5 MG/0.5ML ~~LOC~~ SOAJ: 2.5000 mg | SUBCUTANEOUS | 1 refills | Status: DC

## 2023-02-02 MED ORDER — AMLODIPINE BESYLATE 5 MG PO TABS: 5.0000 mg | ORAL_TABLET | Freq: Every day | ORAL | 3 refills | Status: DC

## 2023-02-02 NOTE — Assessment & Plan Note (Signed)
Will start wegovy or zepbound if he gets approval

## 2023-02-02 NOTE — Assessment & Plan Note (Signed)
BP Readings from Last 3 Encounters:  02/02/23 130/80  06/15/22 (!) 135/100  01/20/22 128/88   Fine on amlodipine 5mg  and losartan/hydrochlorothiazide 100/25

## 2023-02-02 NOTE — Progress Notes (Signed)
Subjective:    Patient ID: Jason Willis, male    DOB: 12/21/1960, 62 y.o.   MRN: [id number]  HPI Here for physical  Is interested again in incretin therapy This is appropriate so he will check  Still working but thinks he may want to retire in 79 months---but maybe work part time Hasn't really gotten back to exercise--since travelling for work a lot in March  Doing well with nightly CPAP Report is on MyChart  Current Outpatient Medications on File Prior to Visit  Medication Sig Dispense Refill   amLODipine (NORVASC) 5 MG tablet TAKE 1 TABLET BY MOUTH EVERY DAY 90 tablet 0   aspirin 81 MG tablet Take 81 mg by mouth daily.     cetirizine (ZYRTEC) 10 MG tablet Take 10 mg by mouth daily.     glucosamine-chondroitin 500-400 MG tablet Take 1 tablet by mouth daily.     losartan-hydrochlorothiazide (HYZAAR) 100-25 MG tablet TAKE 1 TABLET BY MOUTH EVERY DAY 90 tablet 3   multivitamin (THERAGRAN) per tablet Take 1 tablet by mouth daily.     Omega-3 Fatty Acids (FISH OIL) 1000 MG CAPS Take 1,000 mg by mouth daily.     omeprazole (PRILOSEC) 20 MG capsule Take 20 mg by mouth daily.     polyvinyl alcohol (ARTIFICIAL TEARS) 1.4 % ophthalmic solution Place 1 drop into both eyes as needed for dry eyes.     No current facility-administered medications on file prior to visit.    No Known Allergies  Past Medical History:  Diagnosis Date   Allergy    Asthma    Diverticulitis    Dyslipidemia    HDL low    GERD (gastroesophageal reflux disease)    Hyperlipidemia    Hypertension    Seborrheic dermatitis    Sleep apnea    cpap   Sleep disorder    obstructive sleep apnea    Past Surgical History:  Procedure Laterality Date   COLONOSCOPY N/A 06/20/2016   Procedure: COLONOSCOPY;  Surgeon: Hilarie Fredrickson, MD;  Location: [facility];  Service: Endoscopy;  Laterality: N/A;   COLONOSCOPY WITH PROPOFOL N/A 06/15/2022   Procedure: COLONOSCOPY WITH PROPOFOL;  Surgeon: Hilarie Fredrickson, MD;   Location: [facility];  Service: Gastroenterology;  Laterality: N/A;   NO PAST SURGERIES     POLYPECTOMY  06/15/2022   Procedure: POLYPECTOMY;  Surgeon: Hilarie Fredrickson, MD;  Location: [facility];  Service: Gastroenterology;;   WISDOM TOOTH EXTRACTION Bilateral     Family History  Problem Relation Age of Onset   Colon polyps Mother    Anemia Mother    Stroke Mother    COPD Father    Hypertension Father    Coronary artery disease Father    Obesity Sister        gastric bypass    Multiple sclerosis Sister    Cancer Paternal Uncle        2 uncles-- liver and another with new diagnosis (?lung)   Diabetes Neg Hx    Colon cancer Neg Hx    Esophageal cancer Neg Hx    Stomach cancer Neg Hx    Rectal cancer Neg Hx     Social History   Socioeconomic History   Marital status: Married    Spouse name: Not on file   Number of children: 0   Years of education: Not on file   Highest education level: Not on file  Occupational History   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    Comment: Now Warehouse manager  Tobacco Use   Smoking status: Never    Passive exposure: Past   Smokeless tobacco: Never  Substance and Sexual Activity   Alcohol use: Yes    Alcohol/week: 0.0 standard drinks of alcohol    Comment: rare   Drug use: No   Sexual activity: Not on file  Other Topics Concern   Not on file  Social History Narrative   Widowed then remarried 6/05   Social Determinants of Health   Financial Resource Strain: Not on file  Food Insecurity: Not on file  Transportation Needs: Not on file  Physical Activity: Not on file  Stress: Not on file  Social Connections: Not on file  Intimate Partner Violence: Not on file   Review of Systems  Constitutional:  Negative for fatigue and unexpected weight change.       Wears seat belt  HENT:  Negative for dental problem and trouble swallowing.        Slight tinnitus but hearing is okay Keeps up with dentist   Eyes:  Negative for visual disturbance.       No diplopia or unilateral vision loss  Respiratory:  Negative for cough, chest tightness and shortness of breath.   Cardiovascular:  Negative for chest pain, palpitations and leg swelling.  Gastrointestinal:  Negative for blood in stool and constipation.       No heartburn with the omeprazole--uses intermittently  Endocrine: Negative for polydipsia and polyuria.  Genitourinary:        Some leakage at times--dribbling. Not a big deal No sex--no problem  Musculoskeletal:  Negative for back pain and joint swelling.       Some knee pain  Skin:  Negative for rash.       Recent derm visit  Allergic/Immunologic: Positive for environmental allergies. Negative for immunocompromised state.       Zyrtec is effective  Neurological:  Negative for dizziness, syncope, light-headedness and headaches.  Hematological:  Negative for adenopathy. Does not bruise/bleed easily.  Psychiatric/Behavioral:  Negative for dysphoric mood and sleep disturbance. The patient is not nervous/anxious.        Objective:   Physical Exam Constitutional:      Appearance: Normal appearance. He is obese.  HENT:     Mouth/Throat:     Pharynx: No oropharyngeal exudate or posterior oropharyngeal erythema.  Eyes:     Conjunctiva/sclera: Conjunctivae normal.     Pupils: Pupils are equal, round, and reactive to light.  Cardiovascular:     Rate and Rhythm: Normal rate and regular rhythm.     Pulses: Normal pulses.     Heart sounds: No murmur heard.    No gallop.  Pulmonary:     Effort: Pulmonary effort is normal.     Breath sounds: Normal breath sounds. No wheezing or rales.  Abdominal:     Palpations: Abdomen is soft.     Tenderness: There is no abdominal tenderness.  Musculoskeletal:     Cervical back: Neck supple.     Right lower leg: No edema.     Left lower leg: No edema.  Lymphadenopathy:     Cervical: No cervical adenopathy.  Skin:    Findings: No lesion.      Comments: Right axillary intertrigo  Neurological:     General: No focal deficit present.     Mental Status: He is alert and oriented to person, place, and time.  Psychiatric:  Mood and Affect: Mood normal.        Behavior: Behavior normal.            Assessment & Plan:

## 2023-02-02 NOTE — Assessment & Plan Note (Signed)
Healthy but needs to restart exercise Colon due again in 5 years Will check PSA Updated COVID/flu vaccines---and recommended RSV

## 2023-02-02 NOTE — Assessment & Plan Note (Signed)
Uses CPAP nightly and it really helps

## 2023-02-07 ENCOUNTER — Other Ambulatory Visit (HOSPITAL_COMMUNITY): Payer: Self-pay

## 2023-02-07 ENCOUNTER — Telehealth: Payer: Self-pay | Admitting: Pharmacy Technician

## 2023-02-07 NOTE — Telephone Encounter (Signed)
Pharmacy Patient Advocate Encounter   Received notification from CoverMyMeds that prior authorization for Ozempic (0.25 or 0.5 MG/DOSE) 2MG /3ML pen-injectors is required/requested.   Insurance verification completed.   The patient is insured through [facility] .   Per test claim: PA required; PA submitted to [facility] via CoverMyMeds Key/confirmation #/EOC [id number] Status is pending

## 2023-02-12 ENCOUNTER — Other Ambulatory Visit (HOSPITAL_COMMUNITY): Payer: Self-pay

## 2023-02-12 NOTE — Telephone Encounter (Signed)
Pharmacy Patient Advocate Encounter   Received notification from CoverMyMeds that prior authorization for Ozempic (0.25 or 0.5 MG/DOSE) 2MG /3ML pen-injectorsis required/requested.   Insurance verification completed.   The patient is insured through [facility] .   Per test claim: APPROVED from 02/07/2023 to 02/07/2024

## 2023-02-15 ENCOUNTER — Encounter (INDEPENDENT_AMBULATORY_CARE_PROVIDER_SITE_OTHER): Payer: Self-pay

## 2023-03-02 ENCOUNTER — Encounter: Payer: Managed Care, Other (non HMO) | Attending: Internal Medicine | Admitting: Dietician

## 2023-03-02 ENCOUNTER — Encounter: Payer: Self-pay | Admitting: Dietician

## 2023-03-02 ENCOUNTER — Encounter: Payer: Self-pay | Admitting: Internal Medicine

## 2023-03-02 DIAGNOSIS — E119 Type 2 diabetes mellitus without complications: Secondary | ICD-10-CM | POA: Diagnosis present

## 2023-03-02 DIAGNOSIS — Z713 Dietary counseling and surveillance: Secondary | ICD-10-CM | POA: Insufficient documentation

## 2023-03-02 MED ORDER — GLUCOSE BLOOD VI STRP: ORAL_STRIP | 12 refills | Status: AC

## 2023-03-02 MED ORDER — LANCET DEVICE MISC: 1.0000 | Freq: Once | 0 refills | Status: AC

## 2023-03-02 MED ORDER — LANCETS MISC: 1.0000 | Freq: Two times a day (BID) | 12 refills | Status: AC

## 2023-03-02 NOTE — Progress Notes (Signed)
Diabetes Self-Management Education  Visit Type: First/Initial  Appt. Start Time: 0905 Appt. End Time: 1030  03/02/2023  [name], identified by name and date of birth, is a 62 y.o. male with a diagnosis of Diabetes: Type 2.   ASSESSMENT  There were no vitals taken for this visit. There is no height or weight on file to calculate BMI.  Pt reports wife was T1DM, passed away 21 years ago. Pt states they have some prior knowledge of diabetic nutrition. Pt reports starting Ozempic 2 weeks ago, states they feel more energetic throughout the day. Pt reports constipation initially that has been resolved with colace, no other side effects.  Pt reports tracking their food choices on MyFitnessPal app for ~3 years (1,153 days straight) Pt reports a consistent meal schedule for ~10 years, Breakfast at 6:30 am, Lunch at 11:30 am, Dinner at 6:30 pm. Pt reports exercising 3 days a week prior to COVID, still has a Research scientist (physical sciences) to [employer]. Sample meter and instruction provided during visit Contour Next EZ, 100 lancets, 10 strips Lot [id number] Exp Date: 12/30/2021   Diabetes Self-Management Education - 03/02/23 6962       Visit Information   Visit Type First/Initial      Initial Visit   Diabetes Type Type 2    Date Diagnosed 02/2023    Are you currently following a meal plan? No    Are you taking your medications as prescribed? Yes      Health Coping   How would you rate your overall health? Good      Psychosocial Assessment   Patient Belief/Attitude about Diabetes Motivated to manage diabetes    What is the hardest part about your diabetes right now, causing you the most concern, or is the most worrisome to you about your diabetes?   Making healty food and beverage choices;Checking blood sugar    Self-management support Doctor's office    Other persons present Patient    Patient Concerns Nutrition/Meal planning;Glycemic Control;Monitoring    Special Needs None    Preferred  Learning Style Other (comment)    Learning Readiness Ready    How often do you need to have someone help you when you read instructions, pamphlets, or other written materials from your doctor or pharmacy? 1 - Never    What is the last grade level you completed in school? Some college      Pre-Education Assessment   Patient understands the diabetes disease and treatment process. Needs Instruction    Patient understands incorporating nutritional management into lifestyle. Needs Instruction    Patient undertands incorporating physical activity into lifestyle. Needs Instruction    Patient understands using medications safely. Needs Instruction    Patient understands monitoring blood glucose, interpreting and using results Needs Instruction    Patient understands prevention, detection, and treatment of acute complications. Needs Instruction    Patient understands prevention, detection, and treatment of chronic complications. Needs Instruction    Patient understands how to develop strategies to address psychosocial issues. Needs Instruction    Patient understands how to develop strategies to promote health/change behavior. Needs Instruction      Complications   Last HgB A1C per patient/outside source 9.3 %   02/08/2023   How often do you check your blood sugar? 0 times/day (not testing)    Have you had a dilated eye exam in the past 12 months? Yes    Have you had a dental exam in the past 12 months? Yes  Are you checking your feet? No      Dietary Intake   Breakfast English muffin, 1 egg, 1 sausage patty, Unsweet tea    Lunch 6 oz chopped pork, red slaw, 3 hushpuppies, unsweet tea    Dinner 1 cup chicken noodle soup, 1 chicken and broccoli, 1 cup watermelon, water    Beverage(s) Water      Activity / Exercise   Activity / Exercise Type ADL's    How many days per week do you exercise? 0    How many minutes per day do you exercise? 0    Total minutes per week of exercise 0      Patient  Education   Previous Diabetes Education No    Disease Pathophysiology Definition of diabetes, type 1 and 2, and the diagnosis of diabetes;Factors that contribute to the development of diabetes;Explored patient's options for treatment of their diabetes    Healthy Eating Carbohydrate counting;Plate Method;Role of diet in the treatment of diabetes and the relationship between the three main macronutrients and blood glucose level;Food label reading, portion sizes and measuring food.    Being Active Helped patient identify appropriate exercises in relation to his/her diabetes, diabetes complications and other health issue.    Medications Reviewed patients medication for diabetes, action, purpose, timing of dose and side effects.    Monitoring Taught/evaluated SMBG meter.;Identified appropriate SMBG and/or A1C goals.    Chronic complications Relationship between chronic complications and blood glucose control    Diabetes Stress and Support Role of stress on diabetes      Individualized Goals (developed by patient)   Nutrition Carb counting;Follow meal plan discussed;General guidelines for healthy choices and portions discussed    Physical Activity Exercise 3-5 times per week    Medications take my medication as prescribed    Monitoring  Test my blood glucose as discussed    Reducing Risk examine blood glucose patterns   Pt will be making spreadsheet to track FBG     Post-Education Assessment   Patient understands the diabetes disease and treatment process. Comprehends key points    Patient understands incorporating nutritional management into lifestyle. Comprehends key points    Patient undertands incorporating physical activity into lifestyle. Comprehends key points    Patient understands using medications safely. Comphrehends key points    Patient understands monitoring blood glucose, interpreting and using results Comprehends key points    Patient understands prevention, detection, and treatment  of acute complications. Comprehends key points    Patient understands prevention, detection, and treatment of chronic complications. Comprehends key points    Patient understands how to develop strategies to address psychosocial issues. Comprehends key points    Patient understands how to develop strategies to promote health/change behavior. Comprehends key points      Outcomes   Expected Outcomes Demonstrated interest in learning. Expect positive outcomes    Future DMSE 2 months    Program Status Not Completed             Individualized Plan for Diabetes Self-Management Training:   Learning Objective:  Patient will have a greater understanding of diabetes self-management. Patient education plan is to attend individual and/or group sessions per assessed needs and concerns.   Plan:   Patient Instructions  Choose Fairlife brand 2% milk for a low carb, high protein dairy option  [place] job eating three meals a day, about 5-6 hours apart!  Begin to recognize carbohydrates, proteins, and non-starchy vegetables in your food choices!  Have 2 carb  choices at each meal (30 g).   Begin to build your meals using the proportions of the Balanced Plate. First, select your carb choice(s) for the meal, and determine how much you should have to equal 2 carb choices (30 g). Make this 25% of your meal. Next, select your source of protein to pair with your carb choice(s). Make this another 25% of your meal (~20 g). Choose lean proteins as often as possible! Finally, complete your meal with a variety of non-starchy vegetables. Make this the remaining 50% of your meal.  Increase your physical activity gradually at a comfortable pace. Return to [employer] to walk the treadmill for 20 - 30 minutes every 2-3 days.  Check your blood sugar each morning before eating or drinking (fasting). Look for numbers  under 130 mg/dL Check your blood sugar 2 hours after you begin eating a meal. Look for  numbers under 180 mg/dL at all times.  Your goal A1c is below 7.0%    Expected Outcomes:  Demonstrated interest in learning. Expect positive outcomes  Education material provided: Meal plan card and My Plate  If problems or questions, patient to contact team via:  Phone and Email  Future DSME appointment: 2 months

## 2023-03-02 NOTE — Patient Instructions (Addendum)
Choose Fairlife brand 2% milk for a low carb, high protein dairy option  [place] job eating three meals a day, about 5-6 hours apart!  Begin to recognize carbohydrates, proteins, and non-starchy vegetables in your food choices!  Have 2 carb choices at each meal (30 g).   Begin to build your meals using the proportions of the Balanced Plate. First, select your carb choice(s) for the meal, and determine how much you should have to equal 2 carb choices (30 g). Make this 25% of your meal. Next, select your source of protein to pair with your carb choice(s). Make this another 25% of your meal (~20 g). Choose lean proteins as often as possible! Finally, complete your meal with a variety of non-starchy vegetables. Make this the remaining 50% of your meal.  Increase your physical activity gradually at a comfortable pace. Return to [employer] to walk the treadmill for 20 - 30 minutes every 2-3 days.  Check your blood sugar each morning before eating or drinking (fasting). Look for numbers  under 130 mg/dL Check your blood sugar 2 hours after you begin eating a meal. Look for numbers under 180 mg/dL at all times.  Your goal A1c is below 7.0%

## 2023-03-16 ENCOUNTER — Encounter: Payer: Self-pay | Admitting: Internal Medicine

## 2023-03-16 ENCOUNTER — Ambulatory Visit (INDEPENDENT_AMBULATORY_CARE_PROVIDER_SITE_OTHER): Payer: Managed Care, Other (non HMO) | Admitting: Internal Medicine

## 2023-03-16 VITALS — BP 130/88 | HR 74 | Temp 97.3°F | Ht 68.75 in | Wt 373.0 lb

## 2023-03-16 DIAGNOSIS — E119 Type 2 diabetes mellitus without complications: Secondary | ICD-10-CM | POA: Insufficient documentation

## 2023-03-16 DIAGNOSIS — E1159 Type 2 diabetes mellitus with other circulatory complications: Secondary | ICD-10-CM | POA: Insufficient documentation

## 2023-03-16 DIAGNOSIS — Z7985 Long-term (current) use of injectable non-insulin antidiabetic drugs: Secondary | ICD-10-CM | POA: Diagnosis not present

## 2023-03-16 MED ORDER — SEMAGLUTIDE (1 MG/DOSE) 4 MG/3ML ~~LOC~~ SOPN: 1.0000 mg | PEN_INJECTOR | SUBCUTANEOUS | 1 refills | Status: DC

## 2023-03-16 NOTE — Progress Notes (Signed)
Subjective:    Patient ID: Jason Willis, male    DOB: 03-02-1961, 62 y.o.   MRN: [id number]  HPI Here for follow up of newly diagnosed diabetes  Did start the ozempic Took 0.25 mg for 4 weekly doses Just took the 0.5mg  dose today Some constipation---so started colace Discussed changing to senna-s----- 2 tabs once or twice daily  Appetite is down Has lost some weight  Checking sugars most days 107-133 fasting  Did go for the diabetic counseling Goes back in early November  Current Outpatient Medications on File Prior to Visit  Medication Sig Dispense Refill   amLODipine (NORVASC) 5 MG tablet Take 1 tablet (5 mg total) by mouth daily. 90 tablet 3   aspirin 81 MG tablet Take 81 mg by mouth daily.     cetirizine (ZYRTEC) 10 MG tablet Take 10 mg by mouth daily.     docusate sodium (COLACE) 100 MG capsule Take 100 mg by mouth 2 (two) times daily as needed for mild constipation.     fluconazole (DIFLUCAN) 150 MG tablet Take 1 tablet (150 mg total) by mouth once a week. 4 tablet 2   glucosamine-chondroitin 500-400 MG tablet Take 1 tablet by mouth daily.     glucose blood test strip Use to check blood sugar twice daily 100 each 12   Lancets MISC 1 each by Does not apply route 2 (two) times daily. 100 each 12   losartan-hydrochlorothiazide (HYZAAR) 100-25 MG tablet Take 1 tablet by mouth daily. 90 tablet 3   multivitamin (THERAGRAN) per tablet Take 1 tablet by mouth daily.     Omega-3 Fatty Acids (FISH OIL) 1000 MG CAPS Take 1,000 mg by mouth daily.     omeprazole (PRILOSEC) 20 MG capsule Take 20 mg by mouth daily.     polyvinyl alcohol (ARTIFICIAL TEARS) 1.4 % ophthalmic solution Place 1 drop into both eyes as needed for dry eyes.     Semaglutide,0.25 or 0.5MG /DOS, (OZEMPIC, 0.25 OR 0.5 MG/DOSE,) 2 MG/3ML SOPN INJECT 0.25MG  ONCE WEEKLY BY SUBCUTANEOUS ROUTE FOR 4 WKS, THEN INCREASE TO 0.5MG  THEREAFTER 3 mL 1   No current facility-administered medications on file prior to visit.     No Known Allergies  Past Medical History:  Diagnosis Date   Allergy    Asthma    Diverticulitis    Dyslipidemia    HDL low    GERD (gastroesophageal reflux disease)    Hyperlipidemia    Hypertension    Seborrheic dermatitis    Sleep apnea    cpap   Sleep disorder    obstructive sleep apnea    Past Surgical History:  Procedure Laterality Date   COLONOSCOPY N/A 06/20/2016   Procedure: COLONOSCOPY;  Surgeon: Hilarie Fredrickson, MD;  Location: [facility];  Service: Endoscopy;  Laterality: N/A;   COLONOSCOPY WITH PROPOFOL N/A 06/15/2022   Procedure: COLONOSCOPY WITH PROPOFOL;  Surgeon: Hilarie Fredrickson, MD;  Location: [facility];  Service: Gastroenterology;  Laterality: N/A;   NO PAST SURGERIES     POLYPECTOMY  06/15/2022   Procedure: POLYPECTOMY;  Surgeon: Hilarie Fredrickson, MD;  Location: [facility];  Service: Gastroenterology;;   WISDOM TOOTH EXTRACTION Bilateral     Family History  Problem Relation Age of Onset   Colon polyps Mother    Anemia Mother    Stroke Mother    COPD Father    Hypertension Father    Coronary artery disease Father    Obesity Sister  gastric bypass    Multiple sclerosis Sister    Cancer Paternal Uncle        2 uncles-- liver and another with new diagnosis (?lung)   Diabetes Neg Hx    Colon cancer Neg Hx    Esophageal cancer Neg Hx    Stomach cancer Neg Hx    Rectal cancer Neg Hx     Social History   Socioeconomic History   Marital status: Married    Spouse name: Not on file   Number of children: 0   Years of education: Not on file   Highest education level: Some college, no degree  Occupational History   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  Tobacco Use   Smoking status: Never    Passive exposure: Past   Smokeless tobacco: Never  Substance and Sexual Activity   Alcohol use: Yes    Alcohol/week: 0.0 standard drinks of alcohol    Comment: rare   Drug use: No    Sexual activity: Not on file  Other Topics Concern   Not on file  Social History Narrative   Widowed then remarried 6/05   Social Determinants of Health   Financial Resource Strain: Low Risk  (03/12/2023)   Overall Financial Resource Strain (CARDIA)    Difficulty of Paying Living Expenses: Not hard at all  Food Insecurity: No Food Insecurity (03/12/2023)   Hunger Vital Sign    Worried About Running Out of Food in the Last Year: Never true    Ran Out of Food in the Last Year: Never true  Transportation Needs: No Transportation Needs (03/12/2023)   PRAPARE - Administrator, Civil Service (Medical): No    Lack of Transportation (Non-Medical): No  Physical Activity: Insufficiently Active (03/12/2023)   Exercise Vital Sign    Days of Exercise per Week: 1 day    Minutes of Exercise per Session: 30 min  Stress: No Stress Concern Present (03/12/2023)   [employer] of Occupational Health - Occupational Stress Questionnaire    Feeling of Stress : Not at all  Social Connections: Socially Integrated (03/12/2023)   Social Connection and Isolation Panel [NHANES]    Frequency of Communication with Friends and Family: More than three times a week    Frequency of Social Gatherings with Friends and Family: Twice a week    Attends Religious Services: More than 4 times per year    Active Member of [employer] or Organizations: Yes    Attends Engineer, structural: More than 4 times per year    Marital Status: Married  Catering manager Violence: Not on file   Review of Systems Feels well otherwise     Objective:   Physical Exam Constitutional:      Appearance: Normal appearance.  Neurological:     Mental Status: He is alert.  Psychiatric:        Mood and Affect: Mood normal.        Behavior: Behavior normal.            Assessment & Plan:

## 2023-03-16 NOTE — Assessment & Plan Note (Addendum)
Has tolerated the ozempic---first 0.5mg  dose today Will take this again next week--then if no significant issues will increase to 1mg  If after 4 doses he has no problems, will then go right up to 2mg  Will go for follow up with nutrition  Discussed trying senna-s 2 tabs once or twice a day for the constipation

## 2023-04-01 ENCOUNTER — Encounter: Payer: Managed Care, Other (non HMO) | Admitting: Nurse Practitioner

## 2023-04-01 DIAGNOSIS — R399 Unspecified symptoms and signs involving the genitourinary system: Secondary | ICD-10-CM

## 2023-04-01 NOTE — Patient Instructions (Signed)
  Jason Willis, thank you for joining Claiborne Rigg, NP for today's virtual visit.  While this provider is not your primary care provider (PCP), if your PCP is located in our provider database this encounter information will be shared with them immediately following your visit.   [name] account gives you access to today's visit and all your visits, tests, and labs performed at [facility] " click here if you don't have a [name] account or go to [email or web address]  Consent: (Patient) Jason Willis provided verbal consent for this virtual visit at the beginning of the encounter.  Current Medications:  Current Outpatient Medications:    amLODipine (NORVASC) 5 MG tablet, Take 1 tablet (5 mg total) by mouth daily., Disp: 90 tablet, Rfl: 3   aspirin 81 MG tablet, Take 81 mg by mouth daily., Disp: , Rfl:    cetirizine (ZYRTEC) 10 MG tablet, Take 10 mg by mouth daily., Disp: , Rfl:    docusate sodium (COLACE) 100 MG capsule, Take 100 mg by mouth 2 (two) times daily as needed for mild constipation., Disp: , Rfl:    fluconazole (DIFLUCAN) 150 MG tablet, Take 1 tablet (150 mg total) by mouth once a week., Disp: 4 tablet, Rfl: 2   glucosamine-chondroitin 500-400 MG tablet, Take 1 tablet by mouth daily., Disp: , Rfl:    glucose blood test strip, Use to check blood sugar twice daily, Disp: 100 each, Rfl: 12   Lancets MISC, 1 each by Does not apply route 2 (two) times daily., Disp: 100 each, Rfl: 12   losartan-hydrochlorothiazide (HYZAAR) 100-25 MG tablet, Take 1 tablet by mouth daily., Disp: 90 tablet, Rfl: 3   multivitamin (THERAGRAN) per tablet, Take 1 tablet by mouth daily., Disp: , Rfl:    Omega-3 Fatty Acids (FISH OIL) 1000 MG CAPS, Take 1,000 mg by mouth daily., Disp: , Rfl:    omeprazole (PRILOSEC) 20 MG capsule, Take 20 mg by mouth daily., Disp: , Rfl:    polyvinyl alcohol (ARTIFICIAL TEARS) 1.4 % ophthalmic solution, Place 1 drop into both eyes as  needed for dry eyes., Disp: , Rfl:    Semaglutide, 1 MG/DOSE, 4 MG/3ML SOPN, Inject 1 mg as directed once a week., Disp: 3 mL, Rfl: 1   Medications ordered in this encounter:  No orders of the defined types were placed in this encounter.    *If you need refills on other medications prior to your next appointment, please contact your pharmacy*  Follow-Up: Call back or seek an in-person evaluation if the symptoms worsen or if the condition fails to improve as anticipated.  [facility] [phone]  Other Instructions Needs urgent care evaluation   If you have been instructed to have an in-person evaluation today at a local Urgent Care facility, please use the link below. It will take you to a list of all of our available [facility], including address, phone number and hours of operation. Please do not delay care.  [facility]  If you or a family member do not have a primary care provider, use the link below to schedule a visit and establish care. When you choose a [facility] primary care physician or advanced practice provider, you gain a long-term partner in health. Find a Primary Care Provider  Learn more about [facility] in-office and virtual care options: [facility] - Get Care Now

## 2023-04-01 NOTE — Progress Notes (Signed)
Virtual Visit Consent   Jason Willis, you are scheduled for a virtual visit with a [facility] provider today. Just as with appointments in the office, your consent must be obtained to participate. Your consent will be active for this visit and any virtual visit you may have with one of our providers in the next 365 days. If you have a MyChart account, a copy of this consent can be sent to you electronically.  As this is a virtual visit, video technology does not allow for your provider to perform a traditional examination. This may limit your provider's ability to fully assess your condition. If your provider identifies any concerns that need to be evaluated in person or the need to arrange testing (such as labs, EKG, etc.), we will make arrangements to do so. Although advances in technology are sophisticated, we cannot ensure that it will always work on either your end or our end. If the connection with a video visit is poor, the visit may have to be switched to a telephone visit. With either a video or telephone visit, we are not always able to ensure that we have a secure connection.  By engaging in this virtual visit, you consent to the provision of healthcare and authorize for your insurance to be billed (if applicable) for the services provided during this visit. Depending on your insurance coverage, you may receive a charge related to this service.  I need to obtain your verbal consent now. Are you willing to proceed with your visit today? Jason Willis has provided verbal consent on 04/01/2023 for a virtual visit (video or telephone). Claiborne Rigg, NP  Date: 04/01/2023 11:09 AM  Virtual Visit via Video Note   I, Claiborne Rigg, connected with  Jason Willis  ([id number], [birth year]) on 04/01/23 at 11:15 AM EDT by a video-enabled telemedicine application and verified that I am speaking with the correct person using two identifiers.  Location: Patient: Virtual Visit Location Patient:  Home Provider: Virtual Visit Location Provider: Home Office   I discussed the limitations of evaluation and management by telemedicine and the availability of in person appointments. The patient expressed understanding and agreed to proceed.    History of Present Illness: Jason Willis is a 62 y.o. who identifies as a male who was assigned male at birth, and is being seen today for UTI symptoms.  "I believe I have a UTI". Over the past few days Jason Willis has been experiencing: dysuria, hematuria, malodorous urine, and back pain. He does not have a history of UTI but does have a history of diabetes. Denies N/V or fever  Problems:  Patient Active Problem List   Diagnosis Date Noted   Newly diagnosed diabetes (HCC) 03/16/2023   History of colonic polyps 06/15/2022   Benign neoplasm of ascending colon 06/15/2022   Morbid obesity (HCC) 03/21/2019   Colon cancer screening    Benign neoplasm of transverse colon    Benign neoplasm of descending colon    Benign neoplasm of rectum    Diverticulosis of colon without hemorrhage    Routine general medical examination at a health care facility 03/15/2012   ALLERGIC RHINITIS 11/23/2008   Obstructive sleep apnea 06/05/2008   SEBORRHEIC DERMATITIS 05/22/2008   Essential hypertension, benign 03/15/2007   GERD 03/15/2007    Allergies: No Known Allergies Medications:  Current Outpatient Medications:    amLODipine (NORVASC) 5 MG tablet, Take 1 tablet (5 mg total) by mouth daily., Disp: 90 tablet, Rfl: 3   aspirin  81 MG tablet, Take 81 mg by mouth daily., Disp: , Rfl:    cetirizine (ZYRTEC) 10 MG tablet, Take 10 mg by mouth daily., Disp: , Rfl:    docusate sodium (COLACE) 100 MG capsule, Take 100 mg by mouth 2 (two) times daily as needed for mild constipation., Disp: , Rfl:    fluconazole (DIFLUCAN) 150 MG tablet, Take 1 tablet (150 mg total) by mouth once a week., Disp: 4 tablet, Rfl: 2   glucosamine-chondroitin 500-400 MG tablet, Take 1 tablet by  mouth daily., Disp: , Rfl:    glucose blood test strip, Use to check blood sugar twice daily, Disp: 100 each, Rfl: 12   Lancets MISC, 1 each by Does not apply route 2 (two) times daily., Disp: 100 each, Rfl: 12   losartan-hydrochlorothiazide (HYZAAR) 100-25 MG tablet, Take 1 tablet by mouth daily., Disp: 90 tablet, Rfl: 3   multivitamin (THERAGRAN) per tablet, Take 1 tablet by mouth daily., Disp: , Rfl:    Omega-3 Fatty Acids (FISH OIL) 1000 MG CAPS, Take 1,000 mg by mouth daily., Disp: , Rfl:    omeprazole (PRILOSEC) 20 MG capsule, Take 20 mg by mouth daily., Disp: , Rfl:    polyvinyl alcohol (ARTIFICIAL TEARS) 1.4 % ophthalmic solution, Place 1 drop into both eyes as needed for dry eyes., Disp: , Rfl:    Semaglutide, 1 MG/DOSE, 4 MG/3ML SOPN, Inject 1 mg as directed once a week., Disp: 3 mL, Rfl: 1  Observations/Objective: Patient is well-developed, well-nourished in no acute distress.  Resting comfortably at home.  Head is normocephalic, atraumatic.  No labored breathing.  Speech is clear and coherent with logical content.  Patient is alert and oriented at baseline.    Assessment and Plan: 1. UTI symptoms  Jason Willis states he looked on the website to see if this was something we could treat and if he had known we do not treat male UTI symptoms he would not have scheduled the visit due to the charge. I instructed him that there would be no charge for the visit and he will proceed to urgent care for lab testing and face to face evaluation  Follow Up Instructions: I discussed the assessment and treatment plan with the patient. The patient was provided an opportunity to ask questions and all were answered. The patient agreed with the plan and demonstrated an understanding of the instructions.  A copy of instructions were sent to the patient via MyChart unless otherwise noted below.    The patient was advised to call back or seek an in-person evaluation if the symptoms worsen or if the  condition fails to improve as anticipated.  Time:  I spent 4 minutes with the patient via telehealth technology discussing the above problems/concerns.    Claiborne Rigg, NP

## 2023-05-04 ENCOUNTER — Encounter: Payer: Self-pay | Admitting: Dietician

## 2023-05-04 ENCOUNTER — Encounter: Payer: Managed Care, Other (non HMO) | Attending: Internal Medicine | Admitting: Dietician

## 2023-05-04 VITALS — Ht 69.0 in | Wt 357.8 lb

## 2023-05-04 DIAGNOSIS — E119 Type 2 diabetes mellitus without complications: Secondary | ICD-10-CM | POA: Insufficient documentation

## 2023-05-04 NOTE — Patient Instructions (Addendum)
When eating away from home, continue to choose baked, grilled, smoked, or broiled proteins and avoid fried foods. Choose foods that are dry seasoned instead of being in sauce. Moderate cream based sauces and dressings, and choose oil based instead.  Keep up the great work making better food choices every day! Prioritize your protein at each meal!  Focus on the palette changes you are experiencing. Continue to choose unsweetened beverages, and lean proteins like the cedar plank salmon.

## 2023-05-04 NOTE — Progress Notes (Signed)
Diabetes Self-Management Education  Visit Type: Follow-up  Appt. Start Time: 0905 Appt. End Time: 0950  05/04/2023  [name], identified by name and date of birth, is a 62 y.o. male with a diagnosis of Diabetes:  .   ASSESSMENT  Height 5\' 9"  (1.753 m), weight (!) 357 lb 12.8 oz (162.3 kg). Body mass index is 52.84 kg/m.  Pt reports checking blood sugar 1-2 times a week now fasting, numbers started between 120-130 in September, now fasting values between 100-110. Pt reports they will be getting their next A1c in early December. Pt reports getting eye exam, no diabetic complications found. Pt reports continued appetite suppression on Ozempic, states they are eating less than half of their previous portion sizes, taking a lot of to go food when they eat away from. Pt reports eating 3 meals a day routinely, focusing on protein at every meal, prioritizing low carbs, switched to unsweet tea. Pt reports walking for 10 minutes 3 times a day (9:00 AM, 1:00 PM, 3:00 PM) Pt reports taking Senokot BID, no longer experiencing constipation.   Diabetes Self-Management Education - 05/04/23 1111       Visit Information   Visit Type Follow-up      Psychosocial Assessment   What is the hardest part about your diabetes right now, causing you the most concern, or is the most worrisome to you about your diabetes?   Being active    Self-care barriers None    Other persons present Patient      Pre-Education Assessment   Patient understands the diabetes disease and treatment process. Comprehends key points    Patient understands incorporating nutritional management into lifestyle. Comprehends key points    Patient undertands incorporating physical activity into lifestyle. Needs Review    Patient understands using medications safely. Demonstrates understanding / competency    Patient understands monitoring blood glucose, interpreting and using results Comprehends key points    Patient  understands prevention, detection, and treatment of acute complications. Comprehends key points    Patient understands prevention, detection, and treatment of chronic complications. Compreheands key points    Patient understands how to develop strategies to address psychosocial issues. Comprehends key points    Patient understands how to develop strategies to promote health/change behavior. Comprehends key points      Complications   Last HgB A1C per patient/outside source --   No updated A1c at follow-up   How often do you check your blood sugar? 3-4 times / week    Fasting Blood glucose range (mg/dL) 30-865    Postprandial Blood glucose range (mg/dL) --   Not checking PPBG   Have you had a dilated eye exam in the past 12 months? Yes      Dietary Intake   Breakfast Spoonful Scrambled eggs, 2 sausage links ([place] [place])    Lunch Burger w/o bun, (w/ lettuce tomato onion pickles), 5 fries    Dinner No dinner      Activity / Exercise   Activity / Exercise Type ADL's;Light (walking / raking leaves)   Short walks around workplace   How many days per week do you exercise? 5    How many minutes per day do you exercise? 30    Total minutes per week of exercise 150      Patient Education   Healthy Eating Information on hints to eating out and maintain blood glucose control.;Meal options for control of blood glucose level and chronic complications.    Medications Reviewed patients  medication for diabetes, action, purpose, timing of dose and side effects.    Monitoring Identified appropriate SMBG and/or A1C goals.    Chronic complications Relationship between chronic complications and blood glucose control      Individualized Goals (developed by patient)   Nutrition Follow meal plan discussed;General guidelines for healthy choices and portions discussed   Eating away from home, prioritizing protein during weight loss   Physical Activity Exercise 3-5 times per week    Medications take my  medication as prescribed    Monitoring  Test my blood glucose as discussed      Patient Self-Evaluation of Goals - Patient rates self as meeting previously set goals (% of time)   Nutrition >75% (most of the time)    Physical Activity 25 - 50% (sometimes)    Medications >75% (most of the time)    Monitoring 50 - 75 % (half of the time)    Problem Solving and behavior change strategies  50 - 75 % (half of the time)    Reducing Risk (treating acute and chronic complications) 50 - 75 % (half of the time)    Health Coping 50 - 75 % (half of the time)      Post-Education Assessment   Patient understands the diabetes disease and treatment process. Demonstrates understanding / competency    Patient understands incorporating nutritional management into lifestyle. Demonstrates understanding / competency    Patient undertands incorporating physical activity into lifestyle. Comprehends key points    Patient understands using medications safely. Demonstrates understanding / competency    Patient understands monitoring blood glucose, interpreting and using results Comprehends key points    Patient understands prevention, detection, and treatment of acute complications. Comprehends key points    Patient understands prevention, detection, and treatment of chronic complications. Demonstrates understanding / competency    Patient understands how to develop strategies to address psychosocial issues. Demonstrates understanding / competency    Patient understands how to develop strategies to promote health/change behavior. Demonstrates understanding / competency      Outcomes   Expected Outcomes Demonstrated interest in learning. Expect positive outcomes    Future DMSE 3-4 months    Program Status Not Completed      Subsequent Visit   Since your last visit have you continued or begun to take your medications as prescribed? Yes    Since your last visit have you had your blood pressure checked? Yes    Is  your most recent blood pressure lower, unchanged, or higher since your last visit? Unchanged    Since your last visit have you experienced any weight changes? Loss    Weight Loss (lbs) 15    Since your last visit, are you checking your blood glucose at least once a day? No             Individualized Plan for Diabetes Self-Management Training:   Learning Objective:  Patient will have a greater understanding of diabetes self-management. Patient education plan is to attend individual and/or group sessions per assessed needs and concerns.   Plan:   Patient Instructions  When eating away from home, continue to choose baked, grilled, smoked, or broiled proteins and avoid fried foods. Choose foods that are dry seasoned instead of being in sauce. Moderate cream based sauces and dressings, and choose oil based instead.  Keep up the great work making better food choices every day! Prioritize your protein at each meal!  Focus on the palette changes you are experiencing. Continue  to choose unsweetened beverages, and lean proteins like the cedar plank salmon.  Expected Outcomes:  Demonstrated interest in learning. Expect positive outcomes  If problems or questions, patient to contact team via:  Phone and Email  Future DSME appointment: 3-4 months

## 2023-05-16 ENCOUNTER — Telehealth: Payer: Self-pay | Admitting: Internal Medicine

## 2023-05-16 LAB — HM DIABETES EYE EXAM

## 2023-05-16 NOTE — Telephone Encounter (Signed)
Prescription Request  05/16/2023  LOV: 03/16/2023  What is the name of the medication or equipment?  Semaglutide, 1 MG/DOSE, 4 MG/3ML SOPN  Patient has one dose left that he plans to do on friday, also says he would like to go up on the dosage now if Dr. Alphonsus Sias is agreeable. Patient states he needs specific directions on the medication when it does get refilled so the insurance will continue to approve this.   Have you contacted your pharmacy to request a refill? No   Which pharmacy would you like this sent to?  [facility] [address] [facility] - [place], [place] - [address] AT [facility] [address] [place] [address] Phone: [phone] Fax: [phone]    Patient notified that their request is being sent to the clinical staff for review and that they should receive a response within 2 business days.   Please advise at Mobile [phone] (mobile)

## 2023-05-17 MED ORDER — SEMAGLUTIDE-WEIGHT MANAGEMENT 1.7 MG/0.75ML ~~LOC~~ SOAJ: 1.7000 mg | SUBCUTANEOUS | 5 refills | Status: DC

## 2023-05-17 NOTE — Telephone Encounter (Signed)
Spoke to pt

## 2023-05-21 NOTE — Telephone Encounter (Signed)
Pt called asking if switching from Ozempic to [facility] would cause any changes? Call back # [phone]

## 2023-05-21 NOTE — Telephone Encounter (Signed)
Spoke to pt. We discussed that Jason Willis was 1st written prior to him being diagnosed with Diabetes. Would that be a better option?

## 2023-05-21 NOTE — Telephone Encounter (Signed)
He wants to stay on Ozempic. Doing well. Wants to go up to the next available dose for it. Needs new rx for it instead of Wegovy that was sent in last week.

## 2023-05-21 NOTE — Telephone Encounter (Signed)
Pt asking if he was meant to be changed to [facility] instead of Ozempic. If not, he needs a new rx for Ozempic increased dosage to [employer].

## 2023-05-21 NOTE — Telephone Encounter (Signed)
It is not him. It is his insurance. Waiting on PA for [facility]. He will call me back or send a MyChart message if it has not been done by 05-23-23.

## 2023-05-23 ENCOUNTER — Encounter: Payer: Self-pay | Admitting: Internal Medicine

## 2023-05-24 NOTE — Telephone Encounter (Signed)
I did not see a prior Jason Willis has been started in his chart. Sending to PA Team to follow-up.

## 2023-05-25 ENCOUNTER — Other Ambulatory Visit (HOSPITAL_COMMUNITY): Payer: Self-pay

## 2023-05-25 ENCOUNTER — Telehealth: Payer: Self-pay

## 2023-05-25 NOTE — Telephone Encounter (Signed)
Pharmacy Patient Advocate Encounter   Received notification from Patient Advice Request messages that prior authorization for [facility] 1.7MG /0.75ML auto-injectors is required/requested.   Insurance verification completed.   The patient is insured through [facility] .   Per test claim: PA required; PA submitted to above mentioned insurance via CoverMyMeds Key/confirmation #/EOC [id number] Status is pending

## 2023-05-25 NOTE — Telephone Encounter (Signed)
Jason Willis has not been approved, yet. He took his last Ozempic this past week. What dose of Ozempic should we send in for him?

## 2023-05-25 NOTE — Telephone Encounter (Signed)
Nevermind Dr Alphonsus Sias, Jason Willis was approved.

## 2023-05-25 NOTE — Telephone Encounter (Signed)
Pharmacy Patient Advocate Encounter  Received notification from [facility] that Prior Authorization for [facility] has been APPROVED from 05/25/23 to 11/22/23. Ran test claim, Copay is $24.99. This test claim was processed through [facility]- copay amounts may vary at other pharmacies due to pharmacy/plan contracts, or as the patient moves through the different stages of their insurance plan.   PA #/Case ID/Reference #: [id number]

## 2023-06-08 ENCOUNTER — Ambulatory Visit (INDEPENDENT_AMBULATORY_CARE_PROVIDER_SITE_OTHER): Payer: Managed Care, Other (non HMO) | Admitting: Internal Medicine

## 2023-06-08 ENCOUNTER — Encounter: Payer: Self-pay | Admitting: Internal Medicine

## 2023-06-08 VITALS — BP 122/80 | HR 83 | Temp 97.7°F | Ht 68.75 in | Wt 344.0 lb

## 2023-06-08 DIAGNOSIS — E119 Type 2 diabetes mellitus without complications: Secondary | ICD-10-CM | POA: Diagnosis not present

## 2023-06-08 DIAGNOSIS — Z7985 Long-term (current) use of injectable non-insulin antidiabetic drugs: Secondary | ICD-10-CM

## 2023-06-08 DIAGNOSIS — I10 Essential (primary) hypertension: Secondary | ICD-10-CM | POA: Diagnosis not present

## 2023-06-08 DIAGNOSIS — E0959 Drug or chemical induced diabetes mellitus with other circulatory complications: Secondary | ICD-10-CM

## 2023-06-08 LAB — POCT GLYCOSYLATED HEMOGLOBIN (HGB A1C): Hemoglobin A1C: 6.2 % — AB (ref 4.0–5.6)

## 2023-06-08 LAB — HM DIABETES FOOT EXAM

## 2023-06-08 MED ORDER — SEMAGLUTIDE-WEIGHT MANAGEMENT 2.4 MG/0.75ML ~~LOC~~ SOAJ: 2.4000 mg | SUBCUTANEOUS | 3 refills | Status: DC

## 2023-06-08 NOTE — Assessment & Plan Note (Signed)
Lab Results  Component Value Date   HGBA1C 6.2 (A) 06/08/2023   Marked improvement with lifestyle changes and semaglutide Will increase to 2.4mg  weekly

## 2023-06-08 NOTE — Progress Notes (Signed)
Subjective:    Patient ID: Jason Willis, male    DOB: [birth year], 62 y.o.   MRN: [id number]  HPI Here for follow up of diabetes  Has done well on semaglutide Increased to 1.7mg  --will have third dose today Had brief diarrhea after first 1.7mg  dose  Has lost 40# since starting 2 nutrition counseling sessions and will go back again---useful Has started exercise three days per week---total 30 minutes of walking  Current Outpatient Medications on File Prior to Visit  Medication Sig Dispense Refill   amLODipine (NORVASC) 5 MG tablet Take 1 tablet (5 mg total) by mouth daily. 90 tablet 3   aspirin 81 MG tablet Take 81 mg by mouth daily.     cetirizine (ZYRTEC) 10 MG tablet Take 10 mg by mouth daily.     docusate sodium (COLACE) 100 MG capsule Take 100 mg by mouth 2 (two) times daily as needed for mild constipation.     fluconazole (DIFLUCAN) 150 MG tablet Take 1 tablet (150 mg total) by mouth once a week. 4 tablet 2   glucosamine-chondroitin 500-400 MG tablet Take 1 tablet by mouth daily.     glucose blood test strip Use to check blood sugar twice daily 100 each 12   Lancets MISC 1 each by Does not apply route 2 (two) times daily. 100 each 12   losartan-hydrochlorothiazide (HYZAAR) 100-25 MG tablet Take 1 tablet by mouth daily. 90 tablet 3   multivitamin (THERAGRAN) per tablet Take 1 tablet by mouth daily.     Omega-3 Fatty Acids (FISH OIL) 1000 MG CAPS Take 1,000 mg by mouth daily.     omeprazole (PRILOSEC) 20 MG capsule Take 20 mg by mouth daily.     polyvinyl alcohol (ARTIFICIAL TEARS) 1.4 % ophthalmic solution Place 1 drop into both eyes as needed for dry eyes.     Semaglutide-Weight Management 1.7 MG/0.75ML SOAJ Inject 1.7 mg into the skin once a week. 3 mL 5   No current facility-administered medications on file prior to visit.    No Known Allergies  Past Medical History:  Diagnosis Date   Allergy    Asthma    Diverticulitis    Dyslipidemia    HDL low    GERD  (gastroesophageal reflux disease)    Hyperlipidemia    Hypertension    Seborrheic dermatitis    Sleep apnea    cpap   Sleep disorder    obstructive sleep apnea    Past Surgical History:  Procedure Laterality Date   COLONOSCOPY N/A 06/20/2016   Procedure: COLONOSCOPY;  Surgeon: Hilarie Fredrickson, MD;  Location: [facility];  Service: Endoscopy;  Laterality: N/A;   COLONOSCOPY WITH PROPOFOL N/A 06/15/2022   Procedure: COLONOSCOPY WITH PROPOFOL;  Surgeon: Hilarie Fredrickson, MD;  Location: [facility];  Service: Gastroenterology;  Laterality: N/A;   NO PAST SURGERIES     POLYPECTOMY  06/15/2022   Procedure: POLYPECTOMY;  Surgeon: Hilarie Fredrickson, MD;  Location: [facility];  Service: Gastroenterology;;   WISDOM TOOTH EXTRACTION Bilateral     Family History  Problem Relation Age of Onset   Colon polyps Mother    Anemia Mother    Stroke Mother    COPD Father    Hypertension Father    Coronary artery disease Father    Obesity Sister        gastric bypass    Multiple sclerosis Sister    Cancer Paternal Uncle        2 uncles-- liver and  another with new diagnosis (?lung)   Diabetes Neg Hx    Colon cancer Neg Hx    Esophageal cancer Neg Hx    Stomach cancer Neg Hx    Rectal cancer Neg Hx     Social History   Socioeconomic History   Marital status: Married    Spouse name: Not on file   Number of children: 0   Years of education: Not on file   Highest education level: Some college, no degree  Occupational History   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  Tobacco Use   Smoking status: Never    Passive exposure: Past   Smokeless tobacco: Never  Substance and Sexual Activity   Alcohol use: Yes    Alcohol/week: 0.0 standard drinks of alcohol    Comment: rare   Drug use: No   Sexual activity: Not on file  Other Topics Concern   Not on file  Social History Narrative   Widowed then remarried 6/05   Social Determinants  of Health   Financial Resource Strain: Low Risk  (06/04/2023)   Overall Financial Resource Strain (CARDIA)    Difficulty of Paying Living Expenses: Not hard at all  Food Insecurity: No Food Insecurity (06/04/2023)   Hunger Vital Sign    Worried About Running Out of Food in the Last Year: Never true    Ran Out of Food in the Last Year: Never true  Transportation Needs: No Transportation Needs (06/04/2023)   PRAPARE - Administrator, Civil Service (Medical): No    Lack of Transportation (Non-Medical): No  Physical Activity: Insufficiently Active (06/04/2023)   Exercise Vital Sign    Days of Exercise per Week: 3 days    Minutes of Exercise per Session: 10 min  Stress: No Stress Concern Present (06/04/2023)   [employer] of Occupational Health - Occupational Stress Questionnaire    Feeling of Stress : Not at all  Social Connections: Socially Integrated (06/04/2023)   Social Connection and Isolation Panel [NHANES]    Frequency of Communication with Friends and Family: More than three times a week    Frequency of Social Gatherings with Friends and Family: Once a week    Attends Religious Services: More than 4 times per year    Active Member of [employer] or Organizations: Yes    Attends Engineer, structural: More than 4 times per year    Marital Status: Married  Catering manager Violence: Not on file   Review of Systems Has noticed less pain in knees Sleeps okay--uses the CPAP every night No chest pain or SOB    Objective:   Physical Exam Constitutional:      Appearance: Normal appearance.  Cardiovascular:     Rate and Rhythm: Normal rate and regular rhythm.     Pulses: Normal pulses.     Heart sounds: No murmur heard.    No gallop.  Pulmonary:     Effort: Pulmonary effort is normal.     Breath sounds: Normal breath sounds. No wheezing or rales.  Musculoskeletal:     Cervical back: Neck supple.     Right lower leg: No edema.     Left lower leg: No edema.   Lymphadenopathy:     Cervical: No cervical adenopathy.  Skin:    Comments: No foot lesions  Neurological:     Mental Status: He is alert.     Comments: Normal sensation in feet  Assessment & Plan:

## 2023-06-08 NOTE — Assessment & Plan Note (Signed)
BP Readings from Last 3 Encounters:  06/08/23 122/80  03/16/23 130/88  02/02/23 130/80   Controlled on losartan/hydrochlorothiazide 100/25

## 2023-06-19 ENCOUNTER — Encounter: Payer: Self-pay | Admitting: Internal Medicine

## 2023-06-19 NOTE — Telephone Encounter (Signed)
 Care team updated and letter sent for eye exam notes.

## 2023-08-10 ENCOUNTER — Encounter: Payer: Managed Care, Other (non HMO) | Attending: Internal Medicine | Admitting: Dietician

## 2023-08-10 ENCOUNTER — Encounter: Payer: Self-pay | Admitting: Dietician

## 2023-08-10 VITALS — Ht 69.0 in | Wt 336.4 lb

## 2023-08-10 DIAGNOSIS — E119 Type 2 diabetes mellitus without complications: Secondary | ICD-10-CM | POA: Diagnosis present

## 2023-08-10 NOTE — Patient Instructions (Addendum)
 Look up nutrition information for the restaurants you plan to visit while on the road.   Try having Skyr ([employer]) as an alternative to the Oikos Triple Zero for variety.  Try Quest brand protein bars for a high protein, high fiber meal supplement.  Try adding different dried spices to your meals to increase our variety of flavor profiles (Greek, Middle Eastern, Indian, Asian, Latin).

## 2023-08-10 NOTE — Progress Notes (Signed)
 Diabetes Self-Management Education  Visit Type: Follow-up  Appt. Start Time: 0805 Appt. End Time: 0845  08/10/2023  Jason Willis, identified by name and date of birth, is a 63 y.o. male with a diagnosis of Diabetes:  .   ASSESSMENT  Height 5' 9 (1.753 m), weight (!) 336 lb 6.4 oz (152.6 kg). Body mass index is 49.68 kg/m.  Pt reports switching to WeGovy  from Ozempic  early December 2024, currently on 2.4 mg dose, reports continued suppression of appetite, no GI side effects. Pt reports a weight goal of being under 300 by mid-year, and under 270 by the end of 2025 while maintaining glycemic control. Pt reports checking FBG once weekly, values are now consistently between 70 - 90 mg/dL. Pt reports walking 30 minutes daily, is looking to add more walking in the evening as the days get longer. Pt reports still traveling a lot for work, usually Mon-Thurs. Pt states they find it tough to eat healthy on the road at times, will have small portions if having fast food (rarely), or eat a Met[employer] protein bar for dinner most days. Pt reports trying to have balanced meals (protein, vegetables, low carbs) and choosing wide variety of lean protein sources when eating at home. Pt reports drinking 8-10 bottles plain water per day, no SSB, may have coffee a few times a week.    Diabetes Self-Management Education - 08/10/23 1100       Visit Information   Visit Type Follow-up      Health Coping   How would you rate your overall health? Good      Psychosocial Assessment   Learning Readiness Change in progress      Pre-Education Assessment   Patient understands the diabetes disease and treatment process. Demonstrates understanding / competency    Patient understands incorporating nutritional management into lifestyle. Demonstrates understanding / competency    Patient undertands incorporating physical activity into lifestyle. Comprehends key points    Patient understands using medications safely.  Demonstrates understanding / competency    Patient understands monitoring blood glucose, interpreting and using results Demonstrates understanding / competency    Patient understands prevention, detection, and treatment of acute complications. Comprehends key points    Patient understands prevention, detection, and treatment of chronic complications. Demonstrates understanding / competency    Patient understands how to develop strategies to address psychosocial issues. Demonstrates understanding / competency    Patient understands how to develop strategies to promote health/change behavior. Demonstrates understanding / competency      Complications   Last HgB A1C per patient/outside source 6.2 %   06/08/2023   How often do you check your blood sugar? 3-4 times / week    Fasting Blood glucose range (mg/dL) 29-870      Dietary Intake   Breakfast Oikos Triple Zero vanilla    Lunch Teriyaki steak and chicken, stir fry veggies ([place])    Dinner 2 [employer]) water      Activity / Exercise   Activity / Exercise Type ADL's;Light (walking / raking leaves)    How many days per week do you exercise? 7    How many minutes per day do you exercise? 30    Total minutes per week of exercise 210      Patient Education   Disease Pathophysiology Explored patient's options for treatment of their diabetes    Healthy Eating Information on hints to eating out and maintain blood glucose control.;Meal options for control of  blood glucose level and chronic complications.    Being Active Helped patient identify appropriate exercises in relation to his/her diabetes, diabetes complications and other health issue.   considering resistance exercise in the future for lean mass retention   Medications Reviewed patients medication for diabetes, action, purpose, timing of dose and side effects.    Monitoring Identified appropriate SMBG and/or A1C goals.      Individualized Goals (developed  by patient)   Nutrition Follow meal plan discussed    Physical Activity Exercise 5-7 days per week    Medications take my medication as prescribed    Monitoring  Test my blood glucose as discussed    Reducing Risk examine blood glucose patterns   FBG continues to lower     Patient Self-Evaluation of Goals - Patient rates self as meeting previously set goals (% of time)   Nutrition >75% (most of the time)    Physical Activity >75% (most of the time)    Medications >75% (most of the time)    Monitoring 50 - 75 % (half of the time)   Weekly   Problem Solving and behavior change strategies  >75% (most of the time)    Reducing Risk (treating acute and chronic complications) >75% (most of the time)    Health Coping >75% (most of the time)      Post-Education Assessment   Patient understands the diabetes disease and treatment process. Demonstrates understanding / competency    Patient understands incorporating nutritional management into lifestyle. Demonstrates understanding / competency    Patient undertands incorporating physical activity into lifestyle. Demonstrates understanding / competency    Patient understands using medications safely. Demonstrates understanding / competency    Patient understands monitoring blood glucose, interpreting and using results Demonstrates understanding / competency    Patient understands prevention, detection, and treatment of acute complications. Demonstrates understanding / competency    Patient understands prevention, detection, and treatment of chronic complications. Demonstrates understanding / competency    Patient understands how to develop strategies to address psychosocial issues. Demonstrates understanding / competency    Patient understands how to develop strategies to promote health/change behavior. Demonstrates understanding / competency      Outcomes   Expected Outcomes Demonstrated interest in learning. Expect positive outcomes    Future DMSE  3-4 months    Program Status Not Completed      Subsequent Visit   Since your last visit have you continued or begun to take your medications as prescribed? Yes    Since your last visit have you had your blood pressure checked? Yes    Is your most recent blood pressure lower, unchanged, or higher since your last visit? Lower    Since your last visit have you experienced any weight changes? Loss    Weight Loss (lbs) 21    Since your last visit, are you checking your blood glucose at least once a day? No   Weekly            Individualized Plan for Diabetes Self-Management Training:   Learning Objective:  Patient will have a greater understanding of diabetes self-management. Patient education plan is to attend individual and/or group sessions per assessed needs and concerns.   Plan:   Patient Instructions  Look up nutrition information for the restaurants you plan to visit while on the road.   Try having Skyr ([employer]) as an alternative to the Oikos Triple Zero for variety.  Try Quest brand protein bars for a  high protein, high fiber meal supplement.  Try adding different dried spices to your meals to increase our variety of flavor profiles (Greek, Middle Eastern, Indian, Asian, Latin).  Expected Outcomes:  Demonstrated interest in learning. Expect positive outcomes  If problems or questions, patient to contact team via:  Phone and Email  Future DSME appointment: 3-4 months

## 2023-10-12 ENCOUNTER — Ambulatory Visit (INDEPENDENT_AMBULATORY_CARE_PROVIDER_SITE_OTHER): Payer: Managed Care, Other (non HMO) | Admitting: Internal Medicine

## 2023-10-12 ENCOUNTER — Encounter: Payer: Self-pay | Admitting: Internal Medicine

## 2023-10-12 VITALS — BP 110/80 | HR 80 | Temp 97.9°F | Ht 68.75 in | Wt 324.0 lb

## 2023-10-12 DIAGNOSIS — I10 Essential (primary) hypertension: Secondary | ICD-10-CM | POA: Diagnosis not present

## 2023-10-12 DIAGNOSIS — E1159 Type 2 diabetes mellitus with other circulatory complications: Secondary | ICD-10-CM

## 2023-10-12 DIAGNOSIS — Z7985 Long-term (current) use of injectable non-insulin antidiabetic drugs: Secondary | ICD-10-CM

## 2023-10-12 DIAGNOSIS — E119 Type 2 diabetes mellitus without complications: Secondary | ICD-10-CM | POA: Diagnosis not present

## 2023-10-12 LAB — POCT GLYCOSYLATED HEMOGLOBIN (HGB A1C): Hemoglobin A1C: 5.9 % — AB (ref 4.0–5.6)

## 2023-10-12 NOTE — Progress Notes (Signed)
 Subjective:    Patient ID: Jason Willis, male    DOB: 20-Oct-1960, 63 y.o.   MRN: [id number]  HPI Here for follow up of diabetes  Getting used to being diabetic Having smaller portions and limiting sweets, etc Has lost over 70# since starting semaglutide Checking sugars---80-110  No chest pain or SOB Was exercising---till got COVID a month ago Just getting back to walking  Current Outpatient Medications on File Prior to Visit  Medication Sig Dispense Refill   amLODipine (NORVASC) 5 MG tablet Take 1 tablet (5 mg total) by mouth daily. 90 tablet 3   aspirin 81 MG tablet Take 81 mg by mouth daily.     cetirizine (ZYRTEC) 10 MG tablet Take 10 mg by mouth daily.     docusate sodium (COLACE) 100 MG capsule Take 100 mg by mouth 2 (two) times daily as needed for mild constipation.     fluconazole (DIFLUCAN) 150 MG tablet Take 1 tablet (150 mg total) by mouth once a week. 4 tablet 2   glucosamine-chondroitin 500-400 MG tablet Take 1 tablet by mouth daily.     glucose blood test strip Use to check blood sugar twice daily 100 each 12   Lancets MISC 1 each by Does not apply route 2 (two) times daily. 100 each 12   losartan-hydrochlorothiazide (HYZAAR) 100-25 MG tablet Take 1 tablet by mouth daily. 90 tablet 3   multivitamin (THERAGRAN) per tablet Take 1 tablet by mouth daily.     Omega-3 Fatty Acids (FISH OIL) 1000 MG CAPS Take 1,000 mg by mouth daily.     omeprazole (PRILOSEC) 20 MG capsule Take 20 mg by mouth daily.     polyvinyl alcohol (ARTIFICIAL TEARS) 1.4 % ophthalmic solution Place 1 drop into both eyes as needed for dry eyes.     Semaglutide-Weight Management 2.4 MG/0.75ML SOAJ Inject 2.4 mg into the skin once a week. 9 mL 3   senna (SENOKOT) 8.6 MG tablet Take 1 tablet by mouth daily.     No current facility-administered medications on file prior to visit.    No Known Allergies  Past Medical History:  Diagnosis Date   Allergy    Asthma    Diverticulitis    Dyslipidemia     HDL low    GERD (gastroesophageal reflux disease)    Hyperlipidemia    Hypertension    Seborrheic dermatitis    Sleep apnea    cpap   Sleep disorder    obstructive sleep apnea    Past Surgical History:  Procedure Laterality Date   COLONOSCOPY N/A 06/20/2016   Procedure: COLONOSCOPY;  Surgeon: Hilarie Fredrickson, MD;  Location: [facility];  Service: Endoscopy;  Laterality: N/A;   COLONOSCOPY WITH PROPOFOL N/A 06/15/2022   Procedure: COLONOSCOPY WITH PROPOFOL;  Surgeon: Hilarie Fredrickson, MD;  Location: [facility];  Service: Gastroenterology;  Laterality: N/A;   NO PAST SURGERIES     POLYPECTOMY  06/15/2022   Procedure: POLYPECTOMY;  Surgeon: Hilarie Fredrickson, MD;  Location: [facility];  Service: Gastroenterology;;   WISDOM TOOTH EXTRACTION Bilateral     Family History  Problem Relation Age of Onset   Colon polyps Mother    Anemia Mother    Stroke Mother    COPD Father    Hypertension Father    Coronary artery disease Father    Obesity Sister        gastric bypass    Multiple sclerosis Sister    Cancer Paternal Uncle  2 uncles-- liver and another with new diagnosis (?lung)   Diabetes Neg Hx    Colon cancer Neg Hx    Esophageal cancer Neg Hx    Stomach cancer Neg Hx    Rectal cancer Neg Hx     Social History   Socioeconomic History   Marital status: Married    Spouse name: Not on file   Number of children: 0   Years of education: Not on file   Highest education level: Some college, no degree  Occupational History   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  Tobacco Use   Smoking status: Never    Passive exposure: Past   Smokeless tobacco: Never  Substance and Sexual Activity   Alcohol use: Yes    Alcohol/week: 0.0 standard drinks of alcohol    Comment: rare   Drug use: No   Sexual activity: Not on file  Other Topics Concern   Not on file  Social History Narrative   Widowed then remarried 6/05    Social Drivers of Health   Financial Resource Strain: Low Risk  (10/08/2023)   Overall Financial Resource Strain (CARDIA)    Difficulty of Paying Living Expenses: Not hard at all  Food Insecurity: No Food Insecurity (10/08/2023)   Hunger Vital Sign    Worried About Running Out of Food in the Last Year: Never true    Ran Out of Food in the Last Year: Never true  Transportation Needs: No Transportation Needs (10/08/2023)   PRAPARE - Administrator, Civil Service (Medical): No    Lack of Transportation (Non-Medical): No  Physical Activity: Insufficiently Active (10/08/2023)   Exercise Vital Sign    Days of Exercise per Week: 3 days    Minutes of Exercise per Session: 20 min  Stress: No Stress Concern Present (10/08/2023)   [employer] of Occupational Health - Occupational Stress Questionnaire    Feeling of Stress : Not at all  Social Connections: Socially Integrated (10/08/2023)   Social Connection and Isolation Panel [NHANES]    Frequency of Communication with Friends and Family: More than three times a week    Frequency of Social Gatherings with Friends and Family: Twice a week    Attends Religious Services: More than 4 times per year    Active Member of [employer] or Organizations: Yes    Attends Engineer, structural: More than 4 times per year    Marital Status: Married  Catering manager Violence: Not on file   Review of Systems Bowels okay with senna-S Sleeps okay    Objective:   Physical Exam Constitutional:      Appearance: Normal appearance.  Cardiovascular:     Rate and Rhythm: Normal rate and regular rhythm.     Pulses: Normal pulses.     Heart sounds: No murmur heard.    No gallop.  Pulmonary:     Effort: Pulmonary effort is normal.     Breath sounds: Normal breath sounds. No wheezing or rales.  Musculoskeletal:     Cervical back: Neck supple.     Right lower leg: No edema.     Left lower leg: No edema.  Lymphadenopathy:     Cervical: No  cervical adenopathy.  Neurological:     Mental Status: He is alert.            Assessment & Plan:

## 2023-10-12 NOTE — Assessment & Plan Note (Signed)
 Lab Results  Component Value Date   HGBA1C 5.9 (A) 10/12/2023   Excellent response from semaglutide Will change to the ozempic if any insurance issues

## 2023-10-12 NOTE — Assessment & Plan Note (Signed)
 BP Readings from Last 3 Encounters:  10/12/23 110/80  06/08/23 122/80  03/16/23 130/88   Good control on losartan/hydrochlorothiazide 100/25

## 2023-11-23 ENCOUNTER — Encounter: Payer: Managed Care, Other (non HMO) | Attending: Internal Medicine | Admitting: Dietician

## 2023-11-23 ENCOUNTER — Encounter: Payer: Self-pay | Admitting: Dietician

## 2023-11-23 VITALS — Ht 69.0 in | Wt 328.0 lb

## 2023-11-23 DIAGNOSIS — Z713 Dietary counseling and surveillance: Secondary | ICD-10-CM | POA: Diagnosis not present

## 2023-11-23 DIAGNOSIS — Z7985 Long-term (current) use of injectable non-insulin antidiabetic drugs: Secondary | ICD-10-CM | POA: Diagnosis not present

## 2023-11-23 DIAGNOSIS — E119 Type 2 diabetes mellitus without complications: Secondary | ICD-10-CM | POA: Insufficient documentation

## 2023-11-23 NOTE — Patient Instructions (Addendum)
 Set a reminder in your calendar to go to [employer] at 7:00 am on Friday mornings. Start by walking on the treadmill for 30 minutes and finish on the exercise bike for 15 minutes. Visit a [employer] near your job site at least once per work week starting in June!  Continue to look up nutrition facts online and let this help you choose lower fat options. Try to hit ~40g of fat per day!  Begin to cook a little extra at dinner to have leftovers to take to lunch and also shop with lunch meal prep in mind!  Keep up the great work!!

## 2023-11-23 NOTE — Progress Notes (Signed)
 Diabetes Self-Management Education  Visit Type: Follow-up  Appt. Start Time: 0800 Appt. End Time: 0840  11/23/2023  [name], identified by name and date of birth, is a 63 y.o. male with a diagnosis of Diabetes:  .   ASSESSMENT  Height 5\' 9"  (1.753 m), weight (!) 328 lb (148.8 kg). Body mass index is 48.44 kg/m.  Pt reports continuing Wegovy  @2 .4 mg weekly, no GI side effects, previous constipation well controlled with Sennosides. Pt reports not enjoying fried foods anymore, states they don't find them palatable and they upset their stomach. Pt reports they will be working closer to home June, will be able to bring food from home and eat out much less, will have access to fridge and cooking equipment. Pt reports being more active remains difficult, is not getting in any structured exercise. Pt states they would like a workout partner for accountability Pt reports traveling for work frequently. Pt reports renewing [employer] last month, wants to use this as motivation to exercise again.    Diabetes Self-Management Education - 11/23/23 0842       Visit Information   Visit Type Follow-up      Pre-Education Assessment   Patient understands the diabetes disease and treatment process. Comprehends key points    Patient understands incorporating nutritional management into lifestyle. Comprehends key points    Patient undertands incorporating physical activity into lifestyle. Comprehends key points    Patient understands using medications safely. Comprehends key points    Patient understands monitoring blood glucose, interpreting and using results Comprehends key points    Patient understands prevention, detection, and treatment of acute complications. Comprehends key points    Patient understands prevention, detection, and treatment of chronic complications. Compreheands key points    Patient understands how to develop strategies to address psychosocial issues.  Comprehends key points    Patient understands how to develop strategies to promote health/change behavior. Comprehends key points      Complications   Last HgB A1C per patient/outside source 5.9 %   10/12/23   How often do you check your blood sugar? 1-2 times/day    Fasting Blood glucose range (mg/dL) 16-109      Dietary Intake   Breakfast Bacon gruyere egg bites, coffee    Lunch Squash, greens, butter beans, yeast roll    Dinner [employer], 1/2 pita, small greek salad w/ greek dressing    Beverage(s) Coffee, water      Activity / Exercise   Activity / Exercise Type ADL's      Patient Education   Healthy Eating Information on hints to eating out and maintain blood glucose control.;Meal options for control of blood glucose level and chronic complications.    Being Active Helped patient identify appropriate exercises in relation to his/her diabetes, diabetes complications and other health issue.    Monitoring Identified appropriate SMBG and/or A1C goals.    Lifestyle and Health Coping Lifestyle issues that need to be addressed for better diabetes care      Individualized Goals (developed by patient)   Nutrition Follow meal plan discussed    Physical Activity Exercise 1-2 times per week    Medications take my medication as prescribed    Monitoring  Test my blood glucose as discussed    Reducing Risk examine blood glucose patterns      Patient Self-Evaluation of Goals - Patient rates self as meeting previously set goals (% of time)   Nutrition 50 - 75 % (half of  the time)    Physical Activity < 25% (hardly ever/never)    Medications >75% (most of the time)    Monitoring >75% (most of the time)    Problem Solving and behavior change strategies  50 - 75 % (half of the time)    Reducing Risk (treating acute and chronic complications) 50 - 75 % (half of the time)    Health Coping 50 - 75 % (half of the time)      Post-Education Assessment   Patient understands the diabetes disease and  treatment process. Demonstrates understanding / competency    Patient understands incorporating nutritional management into lifestyle. Comprehends key points    Patient undertands incorporating physical activity into lifestyle. Needs Review    Patient understands using medications safely. Demonstrates understanding / competency    Patient understands monitoring blood glucose, interpreting and using results Demonstrates understanding / competency    Patient understands prevention, detection, and treatment of acute complications. Comprehends key points    Patient understands prevention, detection, and treatment of chronic complications. Demonstrates understanding / competency    Patient understands how to develop strategies to address psychosocial issues. Comprehends key points    Patient understands how to develop strategies to promote health/change behavior. Comprehends key points      Outcomes   Expected Outcomes Demonstrated interest in learning. Expect positive outcomes    Future DMSE 3-4 months    Program Status Not Completed      Subsequent Visit   Since your last visit have you continued or begun to take your medications as prescribed? Yes    Since your last visit have you had your blood pressure checked? No    Since your last visit have you experienced any weight changes? Loss    Weight Loss (lbs) 10    Since your last visit, are you checking your blood glucose at least once a day? Yes             Individualized Plan for Diabetes Self-Management Training:   Learning Objective:  Patient will have a greater understanding of diabetes self-management. Patient education plan is to attend individual and/or group sessions per assessed needs and concerns.   Plan:   Patient Instructions  Set a reminder in your calendar to go to [employer] at 7:00 am on Friday mornings. Start by walking on the treadmill for 30 minutes and finish on the exercise bike for 15 minutes. Visit a [employer] near your job site at least once per work week starting in June!  Continue to look up nutrition facts online and let this help you choose lower fat options. Try to hit ~40g of fat per day!  Begin to cook a little extra at dinner to have leftovers to take to lunch and also shop with lunch meal prep in mind!  Keep up the great work!!  Expected Outcomes:  Demonstrated interest in learning. Expect positive outcomes  If problems or questions, patient to contact team via:  Phone and Email  Future DSME appointment: 3-4 months

## 2023-11-30 ENCOUNTER — Encounter: Payer: Self-pay | Admitting: Internal Medicine

## 2023-12-03 ENCOUNTER — Telehealth: Payer: Self-pay

## 2023-12-03 ENCOUNTER — Other Ambulatory Visit (HOSPITAL_COMMUNITY): Payer: Self-pay

## 2023-12-03 MED ORDER — SCOPOLAMINE 1 MG/3DAYS TD PT72: 1.0000 | MEDICATED_PATCH | TRANSDERMAL | 1 refills | Status: DC

## 2023-12-03 NOTE — Telephone Encounter (Signed)
 Pharmacy Patient Advocate Encounter   Received notification from CoverMyMeds that prior authorization for Wegovy  2.4 is required/requested.   Insurance verification completed.   The patient is insured through [facility] .   Per test claim: PA required; PA submitted to above mentioned insurance via CoverMyMeds Key/confirmation #/EOC [facility] Status is pending

## 2023-12-03 NOTE — Telephone Encounter (Signed)
 Pharmacy Patient Advocate Encounter  Received notification from [employer] that Prior Authorization for Wegovy  2.4 has been APPROVED from 12/03/23 to 06/03/24. Unable to obtain price due to refill too soon rejection, last fill date 12/03/23 next available fill date 12/24/23   PA #/Case ID/Reference #: [employer]

## 2024-02-08 ENCOUNTER — Ambulatory Visit (INDEPENDENT_AMBULATORY_CARE_PROVIDER_SITE_OTHER): Payer: No Typology Code available for payment source | Admitting: Internal Medicine

## 2024-02-08 ENCOUNTER — Encounter: Payer: Self-pay | Admitting: Internal Medicine

## 2024-02-08 VITALS — BP 122/88 | HR 75 | Temp 98.0°F | Ht 68.75 in | Wt 330.0 lb

## 2024-02-08 DIAGNOSIS — E1159 Type 2 diabetes mellitus with other circulatory complications: Secondary | ICD-10-CM

## 2024-02-08 DIAGNOSIS — Z23 Encounter for immunization: Secondary | ICD-10-CM | POA: Diagnosis not present

## 2024-02-08 DIAGNOSIS — Z0000 Encounter for general adult medical examination without abnormal findings: Secondary | ICD-10-CM

## 2024-02-08 DIAGNOSIS — Z125 Encounter for screening for malignant neoplasm of prostate: Secondary | ICD-10-CM

## 2024-02-08 DIAGNOSIS — I10 Essential (primary) hypertension: Secondary | ICD-10-CM

## 2024-02-08 DIAGNOSIS — E119 Type 2 diabetes mellitus without complications: Secondary | ICD-10-CM

## 2024-02-08 DIAGNOSIS — G4733 Obstructive sleep apnea (adult) (pediatric): Secondary | ICD-10-CM | POA: Diagnosis not present

## 2024-02-08 DIAGNOSIS — Z7985 Long-term (current) use of injectable non-insulin antidiabetic drugs: Secondary | ICD-10-CM

## 2024-02-08 LAB — LIPID PANEL
Cholesterol: 157 mg/dL (ref 0–200)
HDL: 37.6 mg/dL — ABNORMAL LOW (ref >39.00)
LDL Cholesterol: 100 mg/dL — ABNORMAL HIGH (ref 0–99)
NonHDL: 119.03
Total CHOL/HDL Ratio: 4
Triglycerides: 97 mg/dL (ref 0.0–149.0)
VLDL: 19.4 mg/dL (ref 0.0–40.0)

## 2024-02-08 LAB — COMPREHENSIVE METABOLIC PANEL WITH GFR
ALT: 12 U/L (ref 0–53)
AST: 14 U/L (ref 0–37)
Albumin: 4 g/dL (ref 3.5–5.2)
Alkaline Phosphatase: 60 U/L (ref 39–117)
BUN: 15 mg/dL (ref 6–23)
CO2: 30 meq/L (ref 19–32)
Calcium: 8.8 mg/dL (ref 8.4–10.5)
Chloride: 102 meq/L (ref 96–112)
Creatinine, Ser: 0.73 mg/dL (ref 0.40–1.50)
GFR: 96.2 mL/min (ref >60.00)
Glucose, Bld: 93 mg/dL (ref 70–99)
Potassium: 4.3 meq/L (ref 3.5–5.1)
Sodium: 142 meq/L (ref 135–145)
Total Bilirubin: 0.5 mg/dL (ref 0.2–1.2)
Total Protein: 6.4 g/dL (ref 6.0–8.3)

## 2024-02-08 LAB — HM DIABETES FOOT EXAM

## 2024-02-08 LAB — CBC
HCT: 43.3 % (ref 39.0–52.0)
Hemoglobin: 14.2 g/dL (ref 13.0–17.0)
MCHC: 32.8 g/dL (ref 30.0–36.0)
MCV: 93.5 fl (ref 78.0–100.0)
Platelets: 249 K/uL (ref 150.0–400.0)
RBC: 4.63 Mil/uL (ref 4.22–5.81)
RDW: 14 % (ref 11.5–15.5)
WBC: 6.7 K/uL (ref 4.0–10.5)

## 2024-02-08 LAB — MICROALBUMIN / CREATININE URINE RATIO
Creatinine,U: 134 mg/dL
Microalb Creat Ratio: 13 mg/g (ref 0.0–30.0)
Microalb, Ur: 1.7 mg/dL (ref 0.0–1.9)

## 2024-02-08 LAB — PSA: PSA: 0.4 ng/mL (ref 0.10–4.00)

## 2024-02-08 LAB — HEMOGLOBIN A1C: Hgb A1c MFr Bld: 6.2 % (ref 4.6–6.5)

## 2024-02-08 MED ORDER — ATORVASTATIN CALCIUM 10 MG PO TABS: 10.0000 mg | ORAL_TABLET | Freq: Every day | ORAL | 3 refills | Status: AC

## 2024-02-08 NOTE — Assessment & Plan Note (Signed)
 BP Readings from Last 3 Encounters:  02/08/24 122/88  10/12/23 110/80  06/08/23 122/80   Controlled on losartan /hydrochlorothiazide 100/25

## 2024-02-08 NOTE — Assessment & Plan Note (Signed)
 Doing well Will get back to exercise Colon due 2028 Will check PSA Prefers no flu or COVID vaccines Td today

## 2024-02-08 NOTE — Assessment & Plan Note (Signed)
 Does great with the CPAP nightly

## 2024-02-08 NOTE — Assessment & Plan Note (Signed)
 Good control on semaglutide  2.4mg  weekly Will add low dose atorvastatin 

## 2024-02-08 NOTE — Addendum Note (Signed)
 Addended by: KALLIE CLOTILDA SQUIBB on: 02/08/2024 08:37 AM   Modules accepted: Orders

## 2024-02-08 NOTE — Progress Notes (Signed)
 Subjective:    Patient ID: Jason Willis, male    DOB: 03/20/1961, 63 y.o.   MRN: [id number]  HPI Here for physical  Doing okay Gained back a few pounds---had some eye surgery and with the heat, not able to walk as much Still down 50# Checks sugars once a week----77-118 No foot numbness, tingling or burning  Current Outpatient Medications on File Prior to Visit  Medication Sig Dispense Refill   amLODipine  (NORVASC ) 5 MG tablet Take 1 tablet (5 mg total) by mouth daily. 90 tablet 3   aspirin 81 MG tablet Take 81 mg by mouth daily.     cetirizine (ZYRTEC) 10 MG tablet Take 10 mg by mouth daily.     fluconazole  (DIFLUCAN ) 150 MG tablet Take 1 tablet (150 mg total) by mouth once a week. 4 tablet 2   glucosamine-chondroitin 500-400 MG tablet Take 1 tablet by mouth daily.     glucose blood test strip Use to check blood sugar twice daily 100 each 12   Lancets MISC 1 each by Does not apply route 2 (two) times daily. 100 each 12   losartan -hydrochlorothiazide (HYZAAR) 100-25 MG tablet Take 1 tablet by mouth daily. 90 tablet 3   multivitamin (THERAGRAN) per tablet Take 1 tablet by mouth daily.     Omega-3 Fatty Acids (FISH OIL) 1000 MG CAPS Take 1,000 mg by mouth daily.     omeprazole (PRILOSEC) 20 MG capsule Take 20 mg by mouth daily.     polyvinyl alcohol (ARTIFICIAL TEARS) 1.4 % ophthalmic solution Place 1 drop into both eyes as needed for dry eyes.     Semaglutide -Weight Management 2.4 MG/0.75ML SOAJ Inject 2.4 mg into the skin once a week. 9 mL 3   senna (SENOKOT) 8.6 MG tablet Take 1 tablet by mouth daily.     No current facility-administered medications on file prior to visit.    No Known Allergies  Past Medical History:  Diagnosis Date   Allergy    Asthma    Diverticulitis    Dyslipidemia    HDL low    GERD (gastroesophageal reflux disease)    Hyperlipidemia    Hypertension    Seborrheic dermatitis    Sleep apnea    cpap   Sleep disorder    obstructive sleep apnea     Past Surgical History:  Procedure Laterality Date   COLONOSCOPY N/A 06/20/2016   Procedure: COLONOSCOPY;  Surgeon: Norleen LOISE Kiang, MD;  Location: [facility];  Service: Endoscopy;  Laterality: N/A;   COLONOSCOPY WITH PROPOFOL  N/A 06/15/2022   Procedure: COLONOSCOPY WITH PROPOFOL ;  Surgeon: Kiang Norleen LOISE, MD;  Location: [facility];  Service: Gastroenterology;  Laterality: N/A;   NO PAST SURGERIES     POLYPECTOMY  06/15/2022   Procedure: POLYPECTOMY;  Surgeon: Kiang Norleen LOISE, MD;  Location: [facility];  Service: Gastroenterology;;   WISDOM TOOTH EXTRACTION Bilateral     Family History  Problem Relation Age of Onset   Colon polyps Mother    Anemia Mother    Stroke Mother    COPD Father    Hypertension Father    Coronary artery disease Father    Obesity Sister        gastric bypass    Multiple sclerosis Sister    Cancer Paternal Uncle        2 uncles-- liver and another with new diagnosis (?lung)   Diabetes Neg Hx    Colon cancer Neg Hx    Esophageal cancer Neg Hx  Stomach cancer Neg Hx    Rectal cancer Neg Hx     Social History   Socioeconomic History   Marital status: Married    Spouse name: Not on file   Number of children: 0   Years of education: Not on file   Highest education level: Some college, no degree  Occupational History   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  Tobacco Use   Smoking status: Never    Passive exposure: Past   Smokeless tobacco: Never  Substance and Sexual Activity   Alcohol use: Yes    Alcohol/week: 0.0 standard drinks of alcohol    Comment: rare   Drug use: No   Sexual activity: Not on file  Other Topics Concern   Not on file  Social History Narrative   Widowed then remarried 6/05   Social Drivers of Health   Financial Resource Strain: Low Risk  (10/08/2023)   Overall Financial Resource Strain (CARDIA)    Difficulty of Paying Living Expenses: Not hard at all   Food Insecurity: No Food Insecurity (10/08/2023)   Hunger Vital Sign    Worried About Running Out of Food in the Last Year: Never true    Ran Out of Food in the Last Year: Never true  Transportation Needs: No Transportation Needs (10/08/2023)   PRAPARE - Administrator, Civil Service (Medical): No    Lack of Transportation (Non-Medical): No  Physical Activity: Insufficiently Active (10/08/2023)   Exercise Vital Sign    Days of Exercise per Week: 3 days    Minutes of Exercise per Session: 20 min  Stress: No Stress Concern Present (10/08/2023)   [employer] of Occupational Health - Occupational Stress Questionnaire    Feeling of Stress : Not at all  Social Connections: Socially Integrated (10/08/2023)   Social Connection and Isolation Panel    Frequency of Communication with Friends and Family: More than three times a week    Frequency of Social Gatherings with Friends and Family: Twice a week    Attends Religious Services: More than 4 times per year    Active Member of [employer] or Organizations: Yes    Attends Engineer, structural: More than 4 times per year    Marital Status: Married  Catering manager Violence: Not on file   Review of Systems  Constitutional:  Negative for fatigue.       Wears seat belt  HENT:  Negative for dental problem, hearing loss and tinnitus.        Keeps up with dentist  Eyes:  Negative for visual disturbance.       No diplopia or unilateral vision loss Had corneal surgery   Respiratory:  Negative for cough, chest tightness and shortness of breath.   Cardiovascular:  Negative for chest pain, palpitations and leg swelling.  Gastrointestinal:  Positive for constipation. Negative for blood in stool.       Bowels okay with senna No heartburn  Endocrine: Negative for polydipsia and polyuria.  Genitourinary:  Negative for difficulty urinating and urgency.       No sexual problems  Musculoskeletal:  Negative for arthralgias, back pain and  joint swelling.  Skin:  Negative for rash.       Has spot on back to be checked--rough Has scheduled to start with a derm  Allergic/Immunologic: Positive for environmental allergies. Negative for immunocompromised state.       Happy with zyrtec  in season  Neurological:  Negative for dizziness, syncope, light-headedness and headaches.  Hematological:  Negative for adenopathy. Does not bruise/bleed easily.  Psychiatric/Behavioral:  Negative for dysphoric mood and sleep disturbance. The patient is not nervous/anxious.        Uses CPAP every night with great results. Has second machine for travel       Objective:   Physical Exam Constitutional:      Appearance: Normal appearance.  HENT:     Mouth/Throat:     Pharynx: No oropharyngeal exudate or posterior oropharyngeal erythema.  Eyes:     Conjunctiva/sclera: Conjunctivae normal.     Pupils: Pupils are equal, round, and reactive to light.  Cardiovascular:     Rate and Rhythm: Normal rate and regular rhythm.     Pulses: Normal pulses.     Heart sounds: No murmur heard.    No gallop.  Pulmonary:     Effort: Pulmonary effort is normal.     Breath sounds: Normal breath sounds. No wheezing or rales.  Abdominal:     Palpations: Abdomen is soft.     Tenderness: There is no abdominal tenderness.  Musculoskeletal:     Cervical back: Neck supple.     Right lower leg: No edema.     Left lower leg: No edema.  Lymphadenopathy:     Cervical: No cervical adenopathy.  Skin:    Findings: No lesion or rash.     Comments: No foot lesions  Neurological:     General: No focal deficit present.     Mental Status: He is alert and oriented to person, place, and time.     Comments: Normal sensation in feet  Psychiatric:        Mood and Affect: Mood normal.        Behavior: Behavior normal.            Assessment & Plan:

## 2024-02-09 ENCOUNTER — Ambulatory Visit: Payer: Self-pay | Admitting: Internal Medicine

## 2024-03-06 ENCOUNTER — Telehealth: Payer: Self-pay

## 2024-03-06 MED ORDER — AMLODIPINE BESYLATE 5 MG PO TABS: 5.0000 mg | ORAL_TABLET | Freq: Every day | ORAL | 1 refills | Status: AC

## 2024-03-06 MED ORDER — LOSARTAN POTASSIUM-HCTZ 100-25 MG PO TABS: 1.0000 | ORAL_TABLET | Freq: Every day | ORAL | 1 refills | Status: AC

## 2024-03-06 NOTE — Telephone Encounter (Signed)
 Rx sent electronically.

## 2024-03-07 ENCOUNTER — Ambulatory Visit: Admitting: Dietician

## 2024-04-14 ENCOUNTER — Ambulatory Visit: Payer: Self-pay

## 2024-04-14 ENCOUNTER — Ambulatory Visit: Admitting: Family Medicine

## 2024-04-14 VITALS — BP 124/86 | HR 82 | Temp 97.6°F | Ht 68.75 in | Wt 330.0 lb

## 2024-04-14 DIAGNOSIS — R319 Hematuria, unspecified: Secondary | ICD-10-CM | POA: Diagnosis not present

## 2024-04-14 DIAGNOSIS — N390 Urinary tract infection, site not specified: Secondary | ICD-10-CM | POA: Insufficient documentation

## 2024-04-14 DIAGNOSIS — N3001 Acute cystitis with hematuria: Secondary | ICD-10-CM

## 2024-04-14 LAB — POC URINALSYSI DIPSTICK (AUTOMATED)
Bilirubin, UA: NEGATIVE
Blood, UA: 200 — AB
Glucose, UA: NEGATIVE
Ketones, UA: NEGATIVE
Nitrite, UA: NEGATIVE
Protein, UA: NEGATIVE
Spec Grav, UA: 1.015 (ref 1.010–1.025)
Urobilinogen, UA: 0.2 U/dL
pH, UA: 6 (ref 5.0–8.0)

## 2024-04-14 MED ORDER — CEPHALEXIN 500 MG PO CAPS: 500.0000 mg | ORAL_CAPSULE | Freq: Two times a day (BID) | ORAL | 0 refills | Status: DC

## 2024-04-14 NOTE — Assessment & Plan Note (Signed)
 Gross hematuria with dysuria and urgency  Some very mild right flank tenderness (no /ho stones) Baseline history of b9 micro hematuria  On asa 81 mg daily   Urinalysis with blood and small leukocytes Culture pending  Prescription keflex 500 mg bid - pending culture  Encouraged good water intake   Instructed to call if symptoms worsen in meantime  Call back and Er precautions noted in detail today

## 2024-04-14 NOTE — Telephone Encounter (Signed)
 FYI Only or Action Required?: FYI only for provider.  Patient was last seen in primary care on 02/08/2024 by Jason Charlie FERNS, MD.  Called Nurse Triage reporting Hematuria.  Symptoms began several days ago.  Interventions attempted: Rest, hydration, or home remedies.  Symptoms are: gradually improving.  Triage Disposition: See Physician Within 24 Hours  Patient/caregiver understands and will follow disposition?: Yes, will follow disposition  Copied from CRM #[id number]. Topic: Clinical - Red Word Triage >> Apr 14, 2024  8:08 AM Berwyn MATSU wrote: Red Word that prompted transfer to Nurse Triage: blood in urine, pain while urinating, urgency to urinate Reason for Disposition  Pain or burning with passing urine  Answer Assessment - Initial Assessment Questions 1. COLOR of URINE: Describe the color of the urine.  (e.g., tea-colored, pink, red, bloody) Do you have blood clots in your urine? (e.g., none, pea, grape, small coin)     Clots yesterday, no blood today, dx with chronic hematuria via microscope,  2. ONSET: When did the bleeding start?      About 3 days ago, stopped yesterday 3. EPISODES: How many times has there been blood in the urine? or How many times today?     Every time 4. PAIN with URINATION: Is there any pain with passing your urine? If Yes, ask: How bad is the pain?  (Scale 1-10; or mild, moderate, severe)     Burning at end of urination, 2-3 rated 5. FEVER: Do you have a fever? If Yes, ask: What is your temperature, how was it measured, and when did it start?     denies 6. ASSOCIATED SYMPTOMS: Are you passing urine more frequently than usual?     urgency 7. OTHER SYMPTOMS: Do you have any other symptoms? (e.g., back/flank pain, abdomen pain, vomiting)     Low back pain,  Protocols used: Urine - Blood In-A-AH

## 2024-04-14 NOTE — Telephone Encounter (Signed)
 Will see patient then Agree with ER and UC precautions

## 2024-04-14 NOTE — Progress Notes (Signed)
 Subjective:    Patient ID: Kinta Martis, male    DOB: Sep 06, 1960, 63 y.o.   MRN: [id number]  HPI  Wt Readings from Last 3 Encounters:  04/14/24 (!) 330 lb (149.7 kg)  02/08/24 (!) 330 lb (149.7 kg)  11/23/23 (!) 328 lb (148.8 kg)   49.09 kg/m  Vitals:   04/14/24 1211  BP: 124/86  Pulse: 82  Temp: 97.6 F (36.4 C)  SpO2: 93%    63 yo pt of Dr Jimmy presents for c/o Blood in urine   PMHx notable for  DM2, HTN  Takes asa 81 mg daily   Friday Saw a little blood in urine  Burning to urinate  Some urgency  Passed a decent sized clot yesterday   No n/v Had chills yesterday  Little flank discomfort on right (very mild)    This also happened 3 weeks ago  Got better after drinking cranberry   Visible blood went away today  Had a uti once years ago    Has history of chronic hematuria  Had cystoscopy  Brother and mother have the same thing    Lab Results  Component Value Date   NA 142 02/08/2024   K 4.3 02/08/2024   CO2 30 02/08/2024   GLUCOSE 93 02/08/2024   BUN 15 02/08/2024   CREATININE 0.73 02/08/2024   CALCIUM  8.8 02/08/2024   GFR 96.20 02/08/2024   GFRNONAA 105.41 05/03/2010   Results for orders placed or performed in visit on 04/14/24  POCT Urinalysis Dipstick (Automated)   Collection Time: 04/14/24 12:22 PM  Result Value Ref Range   Color, UA Dark Yellow    Clarity, UA Hazy    Glucose, UA Negative Negative   Bilirubin, UA Negative    Ketones, UA Negative    Spec Grav, UA 1.015 1.010 - 1.025   Blood, UA 200 Ery/uL (A)    pH, UA 6.0 5.0 - 8.0   Protein, UA Negative Negative   Urobilinogen, UA 0.2 0.2 or 1.0 E.U./dL   Nitrite, UA Negative    Leukocytes, UA Small (1+) (A) Negative      Patient Active Problem List   Diagnosis Date Noted   Hematuria 04/14/2024   UTI (urinary tract infection) 04/14/2024   Diabetes mellitus treated with injections of non-insulin medication (HCC) 06/08/2023   Diabetes mellitus with circulatory  complication, without long-term current use of insulin (HCC) 03/16/2023   History of colonic polyps 06/15/2022   Benign neoplasm of ascending colon 06/15/2022   Morbid obesity (HCC) 03/21/2019   Benign neoplasm of transverse colon    Benign neoplasm of descending colon    Benign neoplasm of rectum    Routine general medical examination at a health care facility 03/15/2012   Allergic rhinitis 11/23/2008   Obstructive sleep apnea 06/05/2008   SEBORRHEIC DERMATITIS 05/22/2008   Essential hypertension, benign 03/15/2007   GERD 03/15/2007   Past Medical History:  Diagnosis Date   Allergy    Asthma    Diverticulitis    Dyslipidemia    HDL low    GERD (gastroesophageal reflux disease)    Hyperlipidemia    Hypertension    Seborrheic dermatitis    Sleep apnea    cpap   Sleep disorder    obstructive sleep apnea   Past Surgical History:  Procedure Laterality Date   COLONOSCOPY N/A 06/20/2016   Procedure: COLONOSCOPY;  Surgeon: Norleen LOISE Kiang, MD;  Location: [facility];  Service: Endoscopy;  Laterality: N/A;   COLONOSCOPY WITH PROPOFOL   N/A 06/15/2022   Procedure: COLONOSCOPY WITH PROPOFOL ;  Surgeon: Abran Norleen SAILOR, MD;  Location: [facility];  Service: Gastroenterology;  Laterality: N/A;   NO PAST SURGERIES     POLYPECTOMY  06/15/2022   Procedure: POLYPECTOMY;  Surgeon: Abran Norleen SAILOR, MD;  Location: [facility];  Service: Gastroenterology;;   WISDOM TOOTH EXTRACTION Bilateral    Social History   Tobacco Use   Smoking status: Never    Passive exposure: Past   Smokeless tobacco: Never  Substance Use Topics   Alcohol use: Yes    Alcohol/week: 0.0 standard drinks of alcohol    Comment: rare   Drug use: No   Family History  Problem Relation Age of Onset   Colon polyps Mother    Anemia Mother    Stroke Mother    COPD Father    Hypertension Father    Coronary artery disease Father    Obesity Sister        gastric bypass    Multiple sclerosis Sister    Cancer Paternal  Uncle        2 uncles-- liver and another with new diagnosis (?lung)   Diabetes Neg Hx    Colon cancer Neg Hx    Esophageal cancer Neg Hx    Stomach cancer Neg Hx    Rectal cancer Neg Hx    No Known Allergies Current Outpatient Medications on File Prior to Visit  Medication Sig Dispense Refill   amLODipine  (NORVASC ) 5 MG tablet Take 1 tablet (5 mg total) by mouth daily. 90 tablet 1   aspirin 81 MG tablet Take 81 mg by mouth daily.     atorvastatin  (LIPITOR) 10 MG tablet Take 1 tablet (10 mg total) by mouth daily. 90 tablet 3   cetirizine (ZYRTEC) 10 MG tablet Take 10 mg by mouth daily.     fluconazole  (DIFLUCAN ) 150 MG tablet Take 1 tablet (150 mg total) by mouth once a week. 4 tablet 2   glucosamine-chondroitin 500-400 MG tablet Take 1 tablet by mouth daily.     glucose blood test strip Use to check blood sugar twice daily 100 each 12   Lancets MISC 1 each by Does not apply route 2 (two) times daily. 100 each 12   losartan -hydrochlorothiazide (HYZAAR) 100-25 MG tablet Take 1 tablet by mouth daily. 90 tablet 1   multivitamin (THERAGRAN) per tablet Take 1 tablet by mouth daily.     Omega-3 Fatty Acids (FISH OIL) 1000 MG CAPS Take 1,000 mg by mouth daily.     omeprazole (PRILOSEC) 20 MG capsule Take 20 mg by mouth daily.     polyvinyl alcohol (ARTIFICIAL TEARS) 1.4 % ophthalmic solution Place 1 drop into both eyes as needed for dry eyes.     Semaglutide -Weight Management 2.4 MG/0.75ML SOAJ Inject 2.4 mg into the skin once a week. 9 mL 3   senna (SENOKOT) 8.6 MG tablet Take 1 tablet by mouth daily.     No current facility-administered medications on file prior to visit.    Review of Systems  Constitutional:  Positive for fatigue. Negative for activity change, appetite change and fever.  HENT:  Negative for congestion and sore throat.   Eyes:  Negative for itching and visual disturbance.  Respiratory:  Negative for cough and shortness of breath.   Cardiovascular:  Negative for leg  swelling.  Gastrointestinal:  Negative for abdominal distention, abdominal pain, constipation, diarrhea and nausea.  Endocrine: Negative for cold intolerance and polydipsia.  Genitourinary:  Positive for  dysuria, frequency, hematuria and urgency. Negative for difficulty urinating and flank pain.  Musculoskeletal:  Negative for myalgias.  Skin:  Negative for rash.  Allergic/Immunologic: Negative for immunocompromised state.  Neurological:  Negative for dizziness and weakness.  Hematological:  Negative for adenopathy.       Objective:   Physical Exam Constitutional:      General: He is not in acute distress.    Appearance: Normal appearance. He is well-developed. He is obese. He is not ill-appearing or diaphoretic.  HENT:     Head: Normocephalic and atraumatic.  Eyes:     Conjunctiva/sclera: Conjunctivae normal.     Pupils: Pupils are equal, round, and reactive to light.  Cardiovascular:     Rate and Rhythm: Normal rate and regular rhythm.     Heart sounds: Normal heart sounds.  Pulmonary:     Effort: Pulmonary effort is normal.     Breath sounds: Normal breath sounds.  Abdominal:     General: Bowel sounds are normal. There is no distension.     Palpations: Abdomen is soft.     Tenderness: There is abdominal tenderness. There is no rebound.     Comments: Very mild right CVA tenderness   No suprapubic tenderness or fullness    Musculoskeletal:     Cervical back: Normal range of motion and neck supple.  Lymphadenopathy:     Cervical: No cervical adenopathy.  Skin:    Findings: No rash.  Neurological:     Mental Status: He is alert.  Psychiatric:        Mood and Affect: Mood normal.           Assessment & Plan:   Problem List Items Addressed This Visit       Genitourinary   UTI (urinary tract infection) - Primary   Gross hematuria with dysuria and urgency  Some very mild right flank tenderness (no /ho stones) Baseline history of b9 micro hematuria  On asa 81  mg daily   Urinalysis with blood and small leukocytes Culture pending  Prescription keflex 500 mg bid - pending culture  Encouraged good water intake   Instructed to call if symptoms worsen in meantime  Call back and Er precautions noted in detail today        Relevant Medications   cephALEXin (KEFLEX) 500 MG capsule   Other Relevant Orders   Urine Culture     Other   Hematuria   Pt has history of chronic b9 hereditary hematuria in past Today- mild gross hematuria with other urinary symptoms Suspect uti   Will treat  Culture pending  If not improved/ low threshold for urology referral       Relevant Orders   POCT Urinalysis Dipstick (Automated) (Completed)   Urine Culture

## 2024-04-14 NOTE — Patient Instructions (Addendum)
 Drink lots of water  Take keflex as directed for uti  We will reach out with urine culture result when it returns  If symptoms worsen in the meantime please call  Including fever and nausea

## 2024-04-14 NOTE — Assessment & Plan Note (Signed)
 Pt has history of chronic b9 hereditary hematuria in past Today- mild gross hematuria with other urinary symptoms Suspect uti   Will treat  Culture pending  If not improved/ low threshold for urology referral

## 2024-04-15 LAB — URINE CULTURE
MICRO NUMBER:: 17090997
Result:: NO GROWTH
SPECIMEN QUALITY:: ADEQUATE

## 2024-04-16 ENCOUNTER — Ambulatory Visit: Payer: Self-pay | Admitting: Family Medicine

## 2024-04-16 DIAGNOSIS — R310 Gross hematuria: Secondary | ICD-10-CM

## 2024-05-02 ENCOUNTER — Ambulatory Visit: Admitting: Dietician

## 2024-05-12 ENCOUNTER — Ambulatory Visit: Admitting: Urology

## 2024-05-12 ENCOUNTER — Encounter: Payer: Self-pay | Admitting: Urology

## 2024-05-12 VITALS — BP 139/90 | HR 54 | Ht 68.0 in | Wt 324.0 lb

## 2024-05-12 DIAGNOSIS — R399 Unspecified symptoms and signs involving the genitourinary system: Secondary | ICD-10-CM

## 2024-05-12 DIAGNOSIS — R310 Gross hematuria: Secondary | ICD-10-CM | POA: Diagnosis not present

## 2024-05-12 LAB — URINALYSIS, COMPLETE
Bilirubin, UA: NEGATIVE
Glucose, UA: NEGATIVE
Ketones, UA: NEGATIVE
Leukocytes,UA: NEGATIVE
Nitrite, UA: NEGATIVE
Protein,UA: NEGATIVE
Specific Gravity, UA: 1.02 (ref 1.005–1.030)
Urobilinogen, Ur: 1 mg/dL (ref 0.2–1.0)
pH, UA: 6 (ref 5.0–7.5)

## 2024-05-12 LAB — MICROSCOPIC EXAMINATION

## 2024-05-12 NOTE — Patient Instructions (Signed)
 Scheduling number: [phone]

## 2024-05-12 NOTE — Progress Notes (Signed)
 05/12/2024 8:06 AM   Jason Willis 1960-08-25 [id number]  Referring provider: Randeen Laine LABOR, MD [address] [place],  [place] [address]  Chief Complaint  Patient presents with   Hematuria    HPI: Jason Willis is a 63 y.o. fe male referred for evaluation and management of hematuria  PCP visit 04/14/2024 after noticing a small amount of blood in his urine 3 days prior to that visit and passing a clot the day prior.  His symptoms resolved and late last week he had recurrent symptoms of urinary frequency, urgency, dysuria and occasional episodes of urge incontinence associated with hematuria.  The symptoms resolved yesterday and he is presently asymptomatic.  He also had a similar episode 2 months ago.  No flank, abdominal or pelvic pain Urinalysis: Dipstick positive blood/leukocytes; no microscopy ordered.  Urine culture was negative Gross hematuria: As above Associated lower urinary tract symptoms: As above Baseline lower urinary tract symptoms: None Pain: Denied flank, abdominal or pelvic pain Prior urologic history: Microhematuria evaluation 2004 with negative imaging/cystoscopy Blood thinners/antiplatelet meds: ASA Tobacco history: Negative   PMH: Past Medical History:  Diagnosis Date   Allergy    Asthma    Diverticulitis    Dyslipidemia    HDL low    GERD (gastroesophageal reflux disease)    Hyperlipidemia    Hypertension    Seborrheic dermatitis    Sleep apnea    cpap   Sleep disorder    obstructive sleep apnea    Surgical History: Past Surgical History:  Procedure Laterality Date   COLONOSCOPY N/A 06/20/2016   Procedure: COLONOSCOPY;  Surgeon: Norleen LOISE Kiang, MD;  Location: [facility];  Service: Endoscopy;  Laterality: N/A;   COLONOSCOPY WITH PROPOFOL  N/A 06/15/2022   Procedure: COLONOSCOPY WITH PROPOFOL ;  Surgeon: Kiang Norleen LOISE, MD;  Location: [facility];  Service: Gastroenterology;  Laterality: N/A;   NO PAST SURGERIES     POLYPECTOMY   06/15/2022   Procedure: POLYPECTOMY;  Surgeon: Kiang Norleen LOISE, MD;  Location: [facility];  Service: Gastroenterology;;   WISDOM TOOTH EXTRACTION Bilateral     Home Medications:  Allergies as of 05/12/2024   No Known Allergies      Medication List        Accurate as of May 12, 2024  8:06 AM. If you have any questions, ask your nurse or doctor.          STOP taking these medications    cephALEXin 500 MG capsule Commonly known as: KEFLEX Stopped by: [name]       TAKE these medications    amLODipine  5 MG tablet Commonly known as: NORVASC  Take 1 tablet (5 mg total) by mouth daily.   artificial tears ophthalmic solution Place 1 drop into both eyes as needed for dry eyes.   aspirin 81 MG tablet Take 81 mg by mouth daily.   atorvastatin  10 MG tablet Commonly known as: LIPITOR Take 1 tablet (10 mg total) by mouth daily.   cetirizine 10 MG tablet Commonly known as: ZYRTEC Take 10 mg by mouth daily.   Fish Oil 1000 MG Caps Take 1,000 mg by mouth daily.   fluconazole  150 MG tablet Commonly known as: DIFLUCAN  Take 1 tablet (150 mg total) by mouth once a week.   glucosamine-chondroitin 500-400 MG tablet Take 1 tablet by mouth daily.   glucose blood test strip Use to check blood sugar twice daily   Lancets Misc 1 each by Does not apply route 2 (two)  times daily.   losartan -hydrochlorothiazide 100-25 MG tablet Commonly known as: HYZAAR Take 1 tablet by mouth daily.   multivitamin per tablet Take 1 tablet by mouth daily.   omeprazole 20 MG capsule Commonly known as: PRILOSEC Take 20 mg by mouth daily.   semaglutide -weight management 2.4 MG/0.75ML Soaj SQ injection Commonly known as: WEGOVY  Inject 2.4 mg into the skin once a week.   senna 8.6 MG tablet Commonly known as: SENOKOT Take 1 tablet by mouth daily.        Allergies: No Known Allergies  Family History: Family History  Problem Relation Age of Onset   Colon polyps  Mother    Anemia Mother    Stroke Mother    COPD Father    Hypertension Father    Coronary artery disease Father    Obesity Sister        gastric bypass    Multiple sclerosis Sister    Cancer Paternal Uncle        2 uncles-- liver and another with new diagnosis (?lung)   Diabetes Neg Hx    Colon cancer Neg Hx    Esophageal cancer Neg Hx    Stomach cancer Neg Hx    Rectal cancer Neg Hx     Social History:  reports that he has never smoked. He has been exposed to tobacco smoke. He has never used smokeless tobacco. He reports current alcohol use. He reports that he does not use drugs.   Physical Exam: BP (!) 139/90   Pulse (!) 54   Ht 5' 8 (1.727 m)   Wt (!) 324 lb (147 kg)   BMI 49.26 kg/m   Constitutional:  Alert and oriented, No acute distress. HEENT: [place] AT. Respiratory: Normal respiratory effort, no increased work of breathing. Psychiatric: Normal mood and affect.  Laboratory Data:  Urinalysis Dipstick 1+ blood Microscopy 11-30 RBC   Assessment & Plan:    1.  Gross hematuria Intermittent storage late voiding symptoms associated with gross hematuria.  We discussed possibility of a distal ureteral calculus accounting for symptoms Initially recommend a CT hematuria study If no evidence of upper tract abnormality/stone on CT will schedule cystoscopy  2.  Lower urinary tract symptoms As above   [name], MD  [facility] [address] [place], [place] [address] [phone]

## 2024-05-15 ENCOUNTER — Other Ambulatory Visit (HOSPITAL_COMMUNITY): Payer: Self-pay

## 2024-05-15 ENCOUNTER — Telehealth: Payer: Self-pay

## 2024-05-15 NOTE — Telephone Encounter (Signed)
 This was a patient of Dr Marval  Pharmacy Patient Advocate Encounter   Received notification from [facility] that prior authorization for Wegovy  2.4 is required/requested.   Insurance verification completed.   The patient is insured through [facility].   1) This request is RTS. Last fill 05/12/24 Next fill 06/02/24 2) Patient has current approved PA thru 06/03/24. Can renew closer to that time. 3) Patient is diabetic A1C 9.3%, this is an indication for Ozempic  not Wegovy   Please advise, sent to all pools in [facility] for whichever provider takes Dr Marval patients

## 2024-05-16 MED ORDER — SEMAGLUTIDE-WEIGHT MANAGEMENT 2.4 MG/0.75ML ~~LOC~~ SOAJ: 2.4000 mg | SUBCUTANEOUS | 3 refills | Status: AC

## 2024-05-16 NOTE — Telephone Encounter (Signed)
 Rx sent.

## 2024-05-23 ENCOUNTER — Ambulatory Visit: Admitting: Urology

## 2024-06-06 ENCOUNTER — Encounter: Attending: Nurse Practitioner | Admitting: Dietician

## 2024-06-06 ENCOUNTER — Encounter: Payer: Self-pay | Admitting: Dietician

## 2024-06-06 VITALS — Ht 69.0 in | Wt 332.0 lb

## 2024-06-06 DIAGNOSIS — E119 Type 2 diabetes mellitus without complications: Secondary | ICD-10-CM | POA: Diagnosis not present

## 2024-06-06 DIAGNOSIS — Z713 Dietary counseling and surveillance: Secondary | ICD-10-CM | POA: Diagnosis present

## 2024-06-06 NOTE — Progress Notes (Signed)
 Diabetes Self-Management Education  Visit Type: Follow-up  Appt. Start Time: 0805 Appt. End Time: 0840  06/06/2024  [name], identified by name and date of birth, is a 63 y.o. male with a diagnosis of Diabetes:  .   ASSESSMENT Height 5' 9 (1.753 m), weight (!) 332 lb (150.6 kg). Body mass index is 49.03 kg/m.  Pt reports continuing WeGovy  @2 .4mg , no GI side effects, reports continued appetite suppression, constipation well controlled with Sennosides, started on Atorvastatin  in August, no myalgias. Pt reports urinary urgency and hematuria recently, will be getting CT scan next week to look for bladder and/or kidney stone. Pt reports continuing to travel for work Mon-Thurs, states their date to be moved back local has been moved to February. Pt reports continuing to be active/walking at work. Pt reports choosing less fried foods, more baked/grilled options and monitoring carbs and fat. Pt reports eating a protein bar ([employer]) in the evenings when traveling. Pt reports going to the gym 5 times since last visit, states they have a fitness center in their hotel for work and has been considering utilizing it.   Diabetes Self-Management Education - 06/06/24 0854       Visit Information   Visit Type Follow-up      Psychosocial Assessment   Learning Readiness Change in progress      Pre-Education Assessment   Patient understands the diabetes disease and treatment process. Comprehends key points    Patient understands incorporating nutritional management into lifestyle. Comprehends key points    Patient undertands incorporating physical activity into lifestyle. Needs Review    Patient understands using medications safely. Demonstrates understanding / competency    Patient understands monitoring blood glucose, interpreting and using results Comprehends key points    Patient understands prevention, detection, and treatment of acute complications. N/A (comment)    Patient understands  prevention, detection, and treatment of chronic complications. Compreheands key points    Patient understands how to develop strategies to address psychosocial issues. Comprehends key points    Patient understands how to develop strategies to promote health/change behavior. Needs Review      Complications   Last HgB A1C per patient/outside source 6.2 %   02/08/2024   How often do you check your blood sugar? 1-2 times/day    Fasting Blood glucose range (mg/dL) 29-870      Dietary Intake   Breakfast Roasted pepper egg white bites, coffee w/ cream and Stevia    Lunch Grilled pork chop, steamed squash, salad w/ ranch    [employer], slice of sourdough    Beverage(s) Water, Unsweet tea      Activity / Exercise   Activity / Exercise Type ADL's    How many days per week do you exercise? 0    How many minutes per day do you exercise? 0    Total minutes per week of exercise 0      Patient Education   Disease Pathophysiology Explored patient's options for treatment of their diabetes    Healthy Eating Meal options for control of blood glucose level and chronic complications.    Being Active Role of exercise on diabetes management, blood pressure control and cardiac health.;Helped patient identify appropriate exercises in relation to his/her diabetes, diabetes complications and other health issue.    Monitoring Identified appropriate SMBG and/or A1C goals.    Chronic complications Lipid levels, blood glucose control and heart disease    Diabetes Stress and Support Worked with patient to identify  barriers to care and solutions    Lifestyle and Health Coping Lifestyle issues that need to be addressed for better diabetes care      Individualized Goals (developed by patient)   Nutrition General guidelines for healthy choices and portions discussed    Physical Activity Exercise 1-2 times per week    Medications take my medication as prescribed    Monitoring  Test my blood glucose as  discussed      Patient Self-Evaluation of Goals - Patient rates self as meeting previously set goals (% of time)   Nutrition 50 - 75 % (half of the time)    Physical Activity < 25% (hardly ever/never)    Monitoring >75% (most of the time)    Problem Solving and behavior change strategies  50 - 75 % (half of the time)    Reducing Risk (treating acute and chronic complications) 50 - 75 % (half of the time)    Health Coping 50 - 75 % (half of the time)      Post-Education Assessment   Patient understands the diabetes disease and treatment process. Comprehends key points    Patient understands incorporating nutritional management into lifestyle. Comprehends key points    Patient undertands incorporating physical activity into lifestyle. Needs Review    Patient understands using medications safely. Demonstrates understanding / competency    Patient understands monitoring blood glucose, interpreting and using results Comprehends key points    Patient understands prevention, detection, and treatment of acute complications. Comprehends key points    Patient understands prevention, detection, and treatment of chronic complications. Comprehends key points    Patient understands how to develop strategies to address psychosocial issues. Comprehends key points    Patient understands how to develop strategies to promote health/change behavior. Comprehends key points      Outcomes   Expected Outcomes Demonstrated interest in learning. Expect positive outcomes    Future DMSE 6 months    Program Status Not Completed      Subsequent Visit   Since your last visit have you continued or begun to take your medications as prescribed? Yes    Since your last visit have you had your blood pressure checked? Yes    Is your most recent blood pressure lower, unchanged, or higher since your last visit? Higher    Since your last visit have you experienced any weight changes? Gain    Weight Gain (lbs) 4    Since your  last visit, are you checking your blood glucose at least once a day? Yes          Individualized Plan for Diabetes Self-Management Training:   Learning Objective:  Patient will have a greater understanding of diabetes self-management. Patient education plan is to attend individual and/or group sessions per assessed needs and concerns.   Plan:   Patient Instructions  On Monday and Wednesday in the evening after work, use the fitness center at your hotel and walk on the treadmill for 30 minutes and finish on the exercise bike for 15 minutes.  Visit https://jacobson-moore.net/ or www.wirelesssleep.no for large databases of lower fat, lower sodium, and diabetic friendly recipes.  Consider purchasing an InstantPot to take with you to your job site for easy cooking!  Keep up the great work!!     Expected Outcomes:  Demonstrated interest in learning. Expect positive outcomes  If problems or questions, patient to contact team via:  Phone and Email  Future DSME appointment: 6 months

## 2024-06-06 NOTE — Patient Instructions (Addendum)
 On Monday and Wednesday in the evening after work, use the fitness center at your hotel and walk on the treadmill for 30 minutes and finish on the exercise bike for 15 minutes.  Visit https://jacobson-moore.net/ or www.wirelesssleep.no for large databases of lower fat, lower sodium, and diabetic friendly recipes.  Consider purchasing an InstantPot to take with you to your job site for easy cooking!  Keep up the great work!!

## 2024-06-07 ENCOUNTER — Telehealth: Payer: Self-pay

## 2024-06-07 DIAGNOSIS — E1159 Type 2 diabetes mellitus with other circulatory complications: Secondary | ICD-10-CM

## 2024-06-07 NOTE — Telephone Encounter (Signed)
Updated referral sent.

## 2024-06-07 NOTE — Telephone Encounter (Signed)
-----   Message from Cable P sent at 06/06/2024  8:54 AM EST ----- Regarding: New referral Good morning,  I have the attached patient that has been seen in our office here at [facility]. He was referred by Dr. Jimmy, which no longer comes up in our system. I am not sure if you would be able to help with this.   However, this patient has a follow up appointment in June of 2026 and his referral will expired in May 2026. He has diabetes and would need an updated referral before his next appointment. I know this request is early, however, I was unsure what all I may need to do to acquire it. If you could let me know I would greatly appreciate it.  Thank you,  Izetta SQUIBB Patient Access Specialist [facility] [phone]

## 2024-06-12 ENCOUNTER — Other Ambulatory Visit: Payer: Self-pay | Admitting: *Deleted

## 2024-06-12 DIAGNOSIS — R310 Gross hematuria: Secondary | ICD-10-CM

## 2024-06-12 NOTE — Progress Notes (Signed)
 Got a fax  from [facility] to change the order . Ct abd pelvis wo/w. Order changed

## 2024-06-13 ENCOUNTER — Ambulatory Visit: Admission: RE | Admit: 2024-06-13

## 2024-06-29 ENCOUNTER — Telehealth: Payer: Self-pay | Admitting: Urology

## 2024-06-29 NOTE — Telephone Encounter (Signed)
 CT showed no evidence of stone or upper tract abnormalities.  Recommend scheduling cystoscopy to evaluate for the possibility of bladder tumor/cancer

## 2024-07-30 ENCOUNTER — Other Ambulatory Visit: Payer: Self-pay | Admitting: Urology

## 2024-07-30 ENCOUNTER — Encounter: Payer: Self-pay | Admitting: Urology

## 2024-07-30 ENCOUNTER — Ambulatory Visit (INDEPENDENT_AMBULATORY_CARE_PROVIDER_SITE_OTHER): Admitting: Urology

## 2024-07-30 VITALS — BP 143/93 | HR 90 | Ht 69.0 in | Wt 320.0 lb

## 2024-07-30 DIAGNOSIS — R310 Gross hematuria: Secondary | ICD-10-CM | POA: Diagnosis not present

## 2024-07-30 LAB — URINALYSIS, COMPLETE
Bilirubin, UA: NEGATIVE
Glucose, UA: NEGATIVE
Leukocytes,UA: NEGATIVE
Nitrite, UA: NEGATIVE
Protein,UA: NEGATIVE
Specific Gravity, UA: 1.015 (ref 1.005–1.030)
Urobilinogen, Ur: 0.2 mg/dL (ref 0.2–1.0)
pH, UA: 5.5 (ref 5.0–7.5)

## 2024-07-30 LAB — MICROSCOPIC EXAMINATION: RBC, Urine: >30 /HPF — AB (ref 0–2)

## 2024-07-30 NOTE — Progress Notes (Signed)
" ° °  07/30/24  CC:  Chief Complaint  Patient presents with   Cysto    HPI: Refer to my prior note 05/12/2024.  CT urogram performed [facility] 06/17/2024 showed no upper tract abnormalities and incidental cholelithiasis.  The images are not available for review as this was performed at an outside facility.  Had an episode of recurrent hematuria in late December.  UA today >30 RBC  Blood pressure (!) 143/93, pulse 90, height 5' 9 (1.753 m), weight (!) 320 lb (145.2 kg). NED. A&Ox3.   No respiratory distress   Abd soft, NT, ND Normal phallus with bilateral descended testicles  Cystoscopy Procedure Note  Patient identification was confirmed, informed consent was obtained, and patient was prepped using Betadine solution.  Lidocaine  jelly was administered per urethral meatus.     Pre-Procedure: - Inspection reveals a normal caliber urethral meatus.  Procedure: The flexible cystoscope was introduced without difficulty - No urethral strictures/lesions are present. - Moderate lateral lobe enlargement with hypervascularity prostate  - Normal bladder neck - Bilateral ureteral orifices identified - Bladder mucosa  reveals no solid or papillary tumors.  Area of erythema/mucosal edema upper posterior wall/dome region - No bladder stones - Mild trabeculation  Retroflexion shows no intravesical median lobe or tumor   Post-Procedure: - Patient tolerated the procedure well  Assessment/ Plan: Abnormal appearing bladder mucosa Urine cytology sent Recommend cystoscopy/bladder biopsy however will await urine cytology results prior to scheduling    Jason JAYSON Barba, MD "

## 2024-08-06 LAB — "CYTOLOGY PLUS MONITORING PROFILE: PAP & FEULGEN "

## 2024-08-08 ENCOUNTER — Other Ambulatory Visit: Payer: Self-pay

## 2024-08-08 ENCOUNTER — Ambulatory Visit: Payer: Self-pay | Admitting: Urology

## 2024-08-08 ENCOUNTER — Other Ambulatory Visit: Payer: Self-pay | Admitting: Urology

## 2024-08-08 DIAGNOSIS — R8289 Other abnormal findings on cytological and histological examination of urine: Secondary | ICD-10-CM

## 2024-08-08 DIAGNOSIS — N329 Bladder disorder, unspecified: Secondary | ICD-10-CM

## 2024-08-08 NOTE — Progress Notes (Unsigned)
 Surgical Physician Order Form [facility]  Dr. Glendia Barba, MD  * Scheduling expectation : Next Available  *Length of Case: 45 minutes  *Clearance needed: no  *Anticoagulation Instructions: N/A  *Aspirin Instructions: Hold Aspirin  *Post-op visit Date/Instructions:  1-2 week with pathology review  *Diagnosis: Bladder lesion, abnormal urine cytology  *Procedure:  Cysto Bladder Biopsy (47795); possible TURBT   Additional orders: Gemcitabine 2000mg  bladder instillation  -Admit type: OUTpatient  -Anesthesia: General  -VTE Prophylaxis Standing Order SCD's       Other:   -Standing Lab Orders Per Anesthesia    Lab other: UA&Urine Culture  -Standing Test orders EKG/Chest x-ray per Anesthesia       Test other:   - Medications: Ancef 3 g  -Other orders: On Wegovy 

## 2024-08-15 ENCOUNTER — Encounter

## 2024-08-22 ENCOUNTER — Other Ambulatory Visit

## 2024-09-04 ENCOUNTER — Ambulatory Visit: Admit: 2024-09-04 | Admitting: Urology

## 2024-09-12 ENCOUNTER — Ambulatory Visit: Admitting: Urology

## 2024-12-05 ENCOUNTER — Encounter: Admitting: Dietician
